# Patient Record
Sex: Male | Born: 1965 | Race: Black or African American | Hispanic: No | Smoking: Never smoker
Health system: Southern US, Community
[De-identification: ages and names within clinical notes are randomized; demographics above are authoritative.]

## PROBLEM LIST (undated history)

## (undated) DIAGNOSIS — R252 Cramp and spasm: Secondary | ICD-10-CM

## (undated) DIAGNOSIS — M542 Cervicalgia: Secondary | ICD-10-CM

## (undated) DIAGNOSIS — I10 Essential (primary) hypertension: Secondary | ICD-10-CM

## (undated) HISTORY — DX: Cervicalgia: M54.2

## (undated) HISTORY — DX: Essential (primary) hypertension: I10

## (undated) HISTORY — PX: EYE SURGERY: SHX253

## (undated) HISTORY — DX: Cramp and spasm: R25.2

---

## 2013-07-04 ENCOUNTER — Encounter (HOSPITAL_COMMUNITY): Payer: Self-pay | Admitting: Emergency Medicine

## 2013-07-04 ENCOUNTER — Emergency Department (HOSPITAL_COMMUNITY)
Admission: EM | Admit: 2013-07-04 | Discharge: 2013-07-04 | Disposition: A | Discharge status: Home / Self Care | Payer: PRIVATE HEALTH INSURANCE | Source: Home / Self Care

## 2013-07-04 DIAGNOSIS — S838X9A Sprain of other specified parts of unspecified knee, initial encounter: Secondary | ICD-10-CM

## 2013-07-04 DIAGNOSIS — S86112A Strain of other muscle(s) and tendon(s) of posterior muscle group at lower leg level, left leg, initial encounter: Secondary | ICD-10-CM

## 2013-07-04 DIAGNOSIS — S86819A Strain of other muscle(s) and tendon(s) at lower leg level, unspecified leg, initial encounter: Secondary | ICD-10-CM

## 2013-07-04 NOTE — ED Provider Notes (Signed)
Medical screening examination/treatment/procedure(s) were performed by a resident physician or non-physician practitioner and as the supervising physician I was immediately available for consultation/collaboration.  Masao Junker, MD    Leticia Mcdiarmid S Maricruz Lucero, MD 07/04/13 1810 

## 2013-07-04 NOTE — ED Notes (Signed)
Med  aso   Applied  Affected  ankle

## 2013-07-04 NOTE — ED Provider Notes (Signed)
CSN: [id number]     Arrival date & time 07/04/13  1011 History   First MD Initiated Contact with Patient 07/04/13 1040     Chief Complaint  Patient presents with  . Leg Pain   (Consider location/radiation/quality/duration/timing/severity/associated sxs/prior Treatment) HPI Comments: [age] year old [facility] and felt a pop in the left gastrocnemius muscle. He then stopped he exercises. He is complaining of pain along the mid  groove of the gastrocnemius bodies. Pain with ambulation.    History reviewed. No pertinent past medical history. History reviewed. No pertinent past surgical history. No family history on file. History  Substance Use Topics  . Smoking status: Not on file  . Smokeless tobacco: Not on file  . Alcohol Use: Not on file    Review of Systems  Constitutional: Negative.   Respiratory: Negative.   Gastrointestinal: Negative.   Genitourinary: Negative.   Musculoskeletal: Negative for back pain, neck pain and neck stiffness.       As per HPI  Skin: Negative.   Neurological: Negative for dizziness, weakness, numbness and headaches.    Allergies  Review of patient's allergies indicates no known allergies.  Home Medications  No current outpatient prescriptions on file. BP 146/81  Pulse 74  Temp(Src) 97 F (36.1 C) (Oral)  Resp 18  SpO2 99% Physical Exam  Nursing note and vitals reviewed. Constitutional: He is oriented to person, place, and time. He appears well-developed and well-nourished. No distress.  HENT:  Head: Normocephalic and atraumatic.  Eyes: EOM are normal.  Neck: Normal range of motion. Neck supple.  Pulmonary/Chest: Effort normal. No respiratory distress.  Musculoskeletal:  Minor swelling of the L calf. No discoloration. No palpable muscle contraction suggestive of rupture. Calf muscle with minor tenderness. Soft, no tension, smooth along the length of the leg with normal contour. Distal N/V, M/S  intact. Plantar flexion intact but produces mild to moderate calf pain. No evidence of compartment syndrome.   Neurological: He is alert and oriented to person, place, and time. No cranial nerve deficit.  Skin: Skin is warm and dry.  Psychiatric: He has a normal mood and affect.    ED Course  Procedures (including critical care time) Labs Review Labs Reviewed - No data to display Imaging Review No results found.   MDM   1. Strain of gastrocnemius muscle of left lower extremity      No evidence of rupture. More likely muscle strain with micro tears.  Limit contraction of calf m. and wear ASO to assist with that. Ice then heat. RICE. Limit activity as dir. Slowly rehab as discussed     Hayden Rasmussenavid Dhiren Azimi, NP 07/04/13 1108

## 2013-07-04 NOTE — Discharge Instructions (Signed)
Muscle Strain A muscle strain is an injury that occurs when a muscle is stretched beyond its normal length. Usually a small number of muscle fibers are torn when this happens. Muscle strain is rated in degrees. First-degree strains have the least amount of muscle fiber tearing and pain. Second-degree and third-degree strains have increasingly more tearing and pain.  Usually, recovery from muscle strain takes 1 2 weeks. Complete healing takes 5 6 weeks.  CAUSES  Muscle strain happens when a sudden, violent force placed on a muscle stretches it too far. This may occur with lifting, sports, or a fall.  RISK FACTORS Muscle strain is especially common in athletes.  SIGNS AND SYMPTOMS At the site of the muscle strain, there may be:  Pain.  Bruising.  Swelling.  Difficulty using the muscle due to pain or lack of normal function. DIAGNOSIS  Your health care provider will perform a physical exam and ask about your medical history. TREATMENT  Often, the best treatment for a muscle strain is resting, icing, and applying cold compresses to the injured area.  HOME CARE INSTRUCTIONS   Use the PRICE method of treatment to promote muscle healing during the first 2 3 days after your injury. The PRICE method involves:  Protecting the muscle from being injured again.  Restricting your activity and resting the injured body part.  Icing your injury. To do this, put ice in a plastic bag. Place a towel between your skin and the bag. Then, apply the ice and leave it on from 15 20 minutes each hour. After the third day, switch to moist heat packs.  Apply compression to the injured area with a splint or elastic bandage. Be careful not to wrap it too tightly. This may interfere with blood circulation or increase swelling.  Elevate the injured body part above the level of your heart as often as you can.  Only take over-the-counter or prescription medicines for pain, discomfort, or fever as directed by your  health care provider.  Warming up prior to exercise helps to prevent future muscle strains. SEEK MEDICAL CARE IF:   You have increasing pain or swelling in the injured area.  You have numbness, tingling, or a significant loss of strength in the injured area. MAKE SURE YOU:   Understand these instructions.  Will watch your condition.  Will get help right away if you are not doing well or get worse. Document Released: 03/28/2005 Document Revised: 01/16/2013 Document Reviewed: 10/25/2012 [facility] Patient Information 2014 [place], [place].  Muscle Tear A muscle tear is usually caused by over-exertion or stretching. The muscle often takes a while to heal. Muscle tears require 3 to 4 weeks to heal completely. A history of the injury and a physical exam may be performed. Sometimes, the injury is identified with radiographs and an MRI. Treatment for muscle injuries includes:  Resting and protecting the affected area until pain with motion is gone.  Putting ice on the injured area.  Put ice in a bag.  Place a towel between your skin and the bag.  Leave the ice on for 15 to 20 minutes, 3 to 4 times a day.  After two days, you can use heat to relieve spasms.  Using compression wraps to help control swelling and limit movement.  Raising (elevate) the injured area above the level of the heart (if possible) for the first 1 to 2 days after the injury.  Medicine may be prescribed to reduce pain and inflammation. Avoid strenuous activities that bring  on muscle pain. Exercises to strengthen and stretch the injured muscle can help heal the muscle and prevent repeated injury. After the pain and swelling are gone, you may begin gradual strengthening exercises. Begin range-of-motion exercises and gentle stretching after 3 to 4 days of rest.  SEEK MEDICAL CARE IF:  Your injured muscle is not improving after 1 week of treatment. Document Released: 05/05/2004 Document Revised: 06/20/2011 Document  Reviewed: 10/10/2008 [facility] Patient Information 2014 [place].

## 2013-07-04 NOTE — ED Notes (Signed)
Pt  Reports    Pain   l      Calf        Felt  A   Pop       When         He  Was  Working            [facility]  [facility] [employer]          He  Reports           Pain   On  Weight  Bearing       Pt  denys   Any          Chest  Pain or  Shortness  Of  Breath

## 2013-08-05 ENCOUNTER — Telehealth: Payer: Self-pay

## 2013-08-06 NOTE — Telephone Encounter (Signed)
ERROR

## 2013-12-10 ENCOUNTER — Ambulatory Visit
Admission: RE | Admit: 2013-12-10 | Discharge: 2013-12-10 | Disposition: A | Discharge status: Home / Self Care | Payer: 59 | Source: Ambulatory Visit | Attending: Internal Medicine | Admitting: Internal Medicine

## 2013-12-10 ENCOUNTER — Other Ambulatory Visit: Payer: Self-pay | Admitting: Internal Medicine

## 2013-12-10 DIAGNOSIS — M25512 Pain in left shoulder: Secondary | ICD-10-CM

## 2014-04-11 DIAGNOSIS — R252 Cramp and spasm: Secondary | ICD-10-CM

## 2014-04-11 HISTORY — DX: Cramp and spasm: R25.2

## 2014-10-14 ENCOUNTER — Encounter: Payer: Self-pay | Admitting: Internal Medicine

## 2015-01-27 ENCOUNTER — Encounter: Payer: Self-pay | Admitting: Neurology

## 2015-01-27 ENCOUNTER — Ambulatory Visit (INDEPENDENT_AMBULATORY_CARE_PROVIDER_SITE_OTHER): Payer: BLUE CROSS/BLUE SHIELD | Admitting: Neurology

## 2015-01-27 VITALS — BP 171/108 | HR 64 | Ht 66.0 in | Wt 173.0 lb

## 2015-01-27 DIAGNOSIS — M79605 Pain in left leg: Secondary | ICD-10-CM | POA: Diagnosis not present

## 2015-01-27 DIAGNOSIS — I10 Essential (primary) hypertension: Secondary | ICD-10-CM | POA: Diagnosis not present

## 2015-01-27 DIAGNOSIS — M5010 Cervical disc disorder with radiculopathy, unspecified cervical region: Secondary | ICD-10-CM

## 2015-01-27 MED ORDER — LISINOPRIL 10 MG PO TABS: 10.0000 mg | ORAL_TABLET | Freq: Two times a day (BID) | ORAL | Status: DC

## 2015-01-27 NOTE — Addendum Note (Signed)
Addended by: Levert FeinsteinYAN, Tranise Forrest on: 01/27/2015 09:22 AM   Modules accepted: Level of Service

## 2015-01-27 NOTE — Progress Notes (Signed)
PATIENT: Ryan Rosario DOB: 10-02-1965  Chief Complaint  Patient presents with  . Neck Pain    He has positional neck pain with tingling sensations in his bilateral arms.  He also has tingling in his left index finger and cold sensations in his right 4th and 5th fingers.  He was given gabapentin 300mg , BID by urgent care but has not experienced any relief with the medication.   . Leg Pain    He has been having throbbing, cramping pain in his left hamstring and left calf.     HISTORICAL  Ryan Rosario is a 49 years old left-handed male seen in refer by urgent care PA Alois Clicheracey Aguilar, for evaluation of neck pain, bilateral hands paresthesia in January 27 2015  He had past medical history of hypertension, taking lisinopril 10 mg daily, but with persistent hypertension, today's blood pressure is 170/108  He complains of chronic neck pain for many years, getting worse since 2015, he also noticed almost persistent left index finger numbness cold sensation, as if he is dipping his left index finger in a bucket of ice water, few years back, he used to do weight lifting, he noticed mild bilateral upper extremity weakness, but there was no limitation at his daily activity, he also reported when he hyperextended his neck, he felt radiating numbness traveling down his spine, to his bilateral shoulder, bilateral upper extremity, left worse than right, mainly involving left thumb and index fingers. He denies gait difficulty, since August 2016, he also noticed left posterior thigh and left calf muscle tightness, irritations, he denies bowel and bladder, denies significant low back pain.  REVIEW OF SYSTEMS: Full 14 system review of systems performed and notable only for as above   ALLERGIES: No Known Allergies  HOME MEDICATIONS: Current Outpatient Prescriptions  Medication Sig Dispense Refill  . gabapentin (NEURONTIN) 300 MG capsule Take 300 mg by mouth 2 (two) times daily.    Marland Kitchen. lisinopril  (PRINIVIL,ZESTRIL) 10 MG tablet Take 10 mg by mouth daily.     No current facility-administered medications for this visit.    PAST MEDICAL HISTORY: Past Medical History  Diagnosis Date  . Hypertension   . Neck pain   . Leg cramps     PAST SURGICAL HISTORY: Past Surgical History  Procedure Laterality Date  . Eye surgery      [facility] - 1974    FAMILY HISTORY: Family History  Problem Relation Age of Onset  . Hypertension Mother   . Hypertension Father   . Diabetes Father   . Congestive Heart Failure Father   . Aneurysm Mother     SOCIAL HISTORY:  Social History   Social History  . Marital Status: Single    Spouse Name: N/A  . Number of Children: 1  . Years of Education: BS degree   Occupational History  . Data Coordinator    Social History Main Topics  . Smoking status: Former Games developermoker  . Smokeless tobacco: Not on file  . Alcohol Use: No     Comment: No use since 1983  . Drug Use: No  . Sexual Activity: Not on file   Other Topics Concern  . Not on file   Social History Narrative   Lives at home with his father and his Geophysicist/field seismologistassistant.   Right-handed.   1-2 cups caffeine per day.     PHYSICAL EXAM   Filed Vitals:   01/27/15 0821  BP: 171/108  Pulse: 64  Height: 5\' 6"  (1.676 m)  Weight: 173 lb (78.472 kg)    Not recorded      Body mass index is 27.94 kg/(m^2).  PHYSICAL EXAMNIATION:  Gen: NAD, conversant, well nourised, obese, well groomed                     Cardiovascular: Regular rate rhythm, no peripheral edema, warm, nontender. Eyes: Conjunctivae clear without exudates or hemorrhage Neck: Supple, no carotid bruise. Pulmonary: Clear to auscultation bilaterally   NEUROLOGICAL EXAM:  MENTAL STATUS: Speech:    Speech is normal; fluent and spontaneous with normal comprehension.  Cognition:     Orientation to time, place and person     Normal recent and remote memory     Normal Attention span and concentration     Normal Language, naming,  repeating,spontaneous speech     Fund of knowledge   CRANIAL NERVES: CN II: Visual fields are full to confrontation. Fundoscopic exam is normal with sharp discs and no vascular changes. Pupils are round equal and briskly reactive to light. CN III, IV, VI: extraocular movement are normal. No ptosis. CN V: Facial sensation is intact to pinprick in all 3 divisions bilaterally. Corneal responses are intact.  CN VII: Face is symmetric with normal eye closure and smile. CN VIII: Hearing is normal to rubbing fingers CN IX, X: Palate elevates symmetrically. Phonation is normal. CN XI: Head turning and shoulder shrug are intact CN XII: Tongue is midline with normal movements and no atrophy.  MOTOR: There is no pronator drift of out-stretched arms. Muscle bulk and tone are normal. Muscle strength is normal, with exception of mild left finger abduction weakness  REFLEXES: Reflexes are 2+ and symmetric at the biceps, triceps, knees, and ankles. Plantar responses are flexor.  SENSORY: Intact to light touch, pinprick, position sense, and vibration sense are intact in fingers and toes.  COORDINATION: Rapid alternating movements and fine finger movements are intact. There is no dysmetria on finger-to-nose and heel-knee-shin.    GAIT/STANCE: Posture is normal. Gait is steady with normal steps, base, arm swing, and turning. Heel and toe walking are normal. Tandem gait is normal.  Romberg is absent.   DIAGNOSTIC DATA (LABS, IMAGING, TESTING) - I reviewed patient records, labs, notes, testing and imaging myself where available.   ASSESSMENT AND PLAN  Daunte Oestreich is a 49 y.o. male   Neck pain, radiating pain to bilateral upper extremity, most consistent with cervical radiculopathy  MRI of cervical spine  EMG nerve conduction study Left posterior thigh and and calf muscle cramping  Differentiated diagnosis including left lumbar radiculopathy, left lower extremity musculoskeletal etiology  Levert Feinstein, M.D. Ph.D.  [facility] [address], [address] [place], [place] [address] Ph: [phone] Fax: [phone]  CC: Alois Cliche, PA-

## 2015-02-02 ENCOUNTER — Encounter: Payer: Self-pay | Admitting: Neurology

## 2015-02-18 ENCOUNTER — Ambulatory Visit (INDEPENDENT_AMBULATORY_CARE_PROVIDER_SITE_OTHER): Payer: BLUE CROSS/BLUE SHIELD

## 2015-02-18 DIAGNOSIS — M79605 Pain in left leg: Secondary | ICD-10-CM | POA: Diagnosis not present

## 2015-02-18 DIAGNOSIS — M5010 Cervical disc disorder with radiculopathy, unspecified cervical region: Secondary | ICD-10-CM | POA: Diagnosis not present

## 2015-02-18 DIAGNOSIS — I10 Essential (primary) hypertension: Secondary | ICD-10-CM

## 2015-02-23 NOTE — Telephone Encounter (Signed)
Michelle: Please call patient, cervical mri showed degenerative changes, he has mild C5-6 canal stenosis. Will review findings detail at his next follow-up visit  IMPRESSION: Abnormal MRI scan of the cervical spine showing prominent spondylitic changes from C4-C7 , most severe at C 5-6 resulting in broad-based disc bulge with moderate canal and foraminal narrowing and cord signal abnormality likely myelomalacia

## 2015-02-26 ENCOUNTER — Telehealth: Payer: Self-pay | Admitting: Neurology

## 2015-02-26 NOTE — Telephone Encounter (Signed)
Patient called to advise he saw his MRI results through MyChart and it looks grim, patient wants to know the prognosis "how bad is it". Patient also mentioned that his father's pacemaker information is available through his MyChart account.

## 2015-02-26 NOTE — Telephone Encounter (Signed)
I have called him, MRI cervical showed evidence of prominent spondylitic changes, most severe at C5-6, with moderate canal stenosis, and spinal cord myelomalacia. Overall he has no significant gait difficulty, no bowel and bladder incontinence, chronic neck pain, will keep follow-up appointment in April 08 2015,   IMPRESSION: Abnormal MRI scan of the cervical spine showing prominent spondylitic changes from C4-C7 , most severe at C 5-6 resulting in broad-based disc bulge with moderate canal and foraminal narrowing and cord signal abnormality likely myelomalacia

## 2015-03-03 ENCOUNTER — Encounter: Payer: BLUE CROSS/BLUE SHIELD | Admitting: Neurology

## 2015-04-08 ENCOUNTER — Ambulatory Visit (INDEPENDENT_AMBULATORY_CARE_PROVIDER_SITE_OTHER): Payer: BLUE CROSS/BLUE SHIELD | Admitting: Neurology

## 2015-04-08 DIAGNOSIS — M4712 Other spondylosis with myelopathy, cervical region: Secondary | ICD-10-CM

## 2015-04-08 DIAGNOSIS — M79602 Pain in left arm: Secondary | ICD-10-CM | POA: Diagnosis not present

## 2015-04-08 DIAGNOSIS — M5010 Cervical disc disorder with radiculopathy, unspecified cervical region: Secondary | ICD-10-CM

## 2015-04-08 DIAGNOSIS — M79605 Pain in left leg: Secondary | ICD-10-CM

## 2015-04-08 DIAGNOSIS — I10 Essential (primary) hypertension: Secondary | ICD-10-CM

## 2015-04-09 ENCOUNTER — Encounter: Payer: Self-pay | Admitting: Neurology

## 2015-04-09 DIAGNOSIS — M47812 Spondylosis without myelopathy or radiculopathy, cervical region: Secondary | ICD-10-CM | POA: Insufficient documentation

## 2015-04-09 NOTE — Addendum Note (Signed)
Addended by: Levert FeinsteinYAN, Damarian Priola on: 04/09/2015 04:25 PM   Modules accepted: Level of Service

## 2015-04-09 NOTE — Progress Notes (Signed)
PATIENT: Ryan Rosario DOB: September 13, 1965  No chief complaint on file.    HISTORICAL  Ryan Rosario is a 49 years old left-handed male seen in refer by urgent care PA Alois Clicheracey Aguilar, for evaluation of neck pain, bilateral hands paresthesia in January 27 2015  He had past medical history of hypertension, taking lisinopril 10 mg daily, but with persistent hypertension, today's blood pressure is 170/108  He complains of chronic neck pain for many years, getting worse since 2015, he also noticed almost persistent left index finger numbness cold sensation, as if he is dipping his left index finger in a bucket of ice water, few years back, he used to do weight lifting, he noticed mild bilateral upper extremity weakness, but there was no limitation at his daily activity, he also reported when he hyperextended his neck, he felt radiating numbness traveling down his spine, to his bilateral shoulder, bilateral upper extremity, left worse than right, mainly involving left thumb and index fingers. He denies gait difficulty, since August 2016, he also noticed left posterior thigh and left calf muscle tightness, irritations, he denies bowel and bladder, denies significant low back pain.  Update April 08 2015: Patient come in for electrodiagnostic study today, which showed no significant abnormality, in specific there was no evidence of left upper extremity neuropathy or left cervical radiculopathy.  We also personally reviewed MRI of the cervical spine November 2016: prominent spondylitic changes from C4-C7 , most severe at C 5-6 resulting in broad-based disc bulge with moderate canal and foraminal narrowing and cord signal abnormality likely myelomalacia  He denies significant gait difficulty no bowel bladder incontinence, continue has left side neck pain radiating pain to left upper extremity, hyperreflexia of upper and lower extremity on examinations,  REVIEW OF SYSTEMS: Full 14 system review of  systems performed and notable only for as above   ALLERGIES: No Known Allergies  HOME MEDICATIONS: Current Outpatient Prescriptions  Medication Sig Dispense Refill  . gabapentin (NEURONTIN) 300 MG capsule Take 300 mg by mouth 2 (two) times daily.    Marland Kitchen. lisinopril (PRINIVIL,ZESTRIL) 10 MG tablet Take 1 tablet (10 mg total) by mouth 2 (two) times daily. 60 tablet 6   No current facility-administered medications for this visit.    PAST MEDICAL HISTORY: Past Medical History  Diagnosis Date  . Hypertension   . Neck pain   . Leg cramps     PAST SURGICAL HISTORY: Past Surgical History  Procedure Laterality Date  . Eye surgery      [facility] - 1974    FAMILY HISTORY: Family History  Problem Relation Age of Onset  . Hypertension Mother   . Hypertension Father   . Diabetes Father   . Congestive Heart Failure Father   . Aneurysm Mother     SOCIAL HISTORY:  Social History   Social History  . Marital Status: Single    Spouse Name: N/A  . Number of Children: 1  . Years of Education: BS degree   Occupational History  . Data Coordinator    Social History Main Topics  . Smoking status: Former Games developermoker  . Smokeless tobacco: Not on file  . Alcohol Use: No     Comment: No use since 1983  . Drug Use: No  . Sexual Activity: Not on file   Other Topics Concern  . Not on file   Social History Narrative   Lives at home with his father and his Geophysicist/field seismologistassistant.   Right-handed.   1-2 cups caffeine per  day.     PHYSICAL EXAM   There were no vitals filed for this visit.  Not recorded      There is no weight on file to calculate BMI.  PHYSICAL EXAMNIATION:  Gen: NAD, conversant, well nourised, obese, well groomed                     Cardiovascular: Regular rate rhythm, no peripheral edema, warm, nontender. Eyes: Conjunctivae clear without exudates or hemorrhage Neck: Supple, no carotid bruise. Pulmonary: Clear to auscultation bilaterally   NEUROLOGICAL EXAM:  MENTAL  STATUS: Speech:    Speech is normal; fluent and spontaneous with normal comprehension.  Cognition:     Orientation to time, place and person     Normal recent and remote memory     Normal Attention span and concentration     Normal Language, naming, repeating,spontaneous speech     Fund of knowledge   CRANIAL NERVES: CN II: Visual fields are full to confrontation. Fundoscopic exam is normal with sharp discs and no vascular changes. Pupils are round equal and briskly reactive to light. CN III, IV, VI: extraocular movement are normal. No ptosis. CN V: Facial sensation is intact to pinprick in all 3 divisions bilaterally. Corneal responses are intact.  CN VII: Face is symmetric with normal eye closure and smile. CN VIII: Hearing is normal to rubbing fingers CN IX, X: Palate elevates symmetrically. Phonation is normal. CN XI: Head turning and shoulder shrug are intact CN XII: Tongue is midline with normal movements and no atrophy.  MOTOR: There is no pronator drift of out-stretched arms. Muscle bulk and tone are normal. Muscle strength is normal, with exception of mild left finger abduction weakness  REFLEXES: Reflexes are 3 and symmetric at the biceps, triceps, knees, and ankles. Plantar responses are flexor.  SENSORY: Intact to light touch, pinprick, position sense, and vibration sense are intact in fingers and toes.  COORDINATION: Rapid alternating movements and fine finger movements are intact. There is no dysmetria on finger-to-nose and heel-knee-shin.    GAIT/STANCE: Posture is normal. Gait is steady with normal steps, base, arm swing, and turning. Heel and toe walking are normal. Tandem gait is normal.  Romberg is absent.   DIAGNOSTIC DATA (LABS, IMAGING, TESTING) - I reviewed patient records, labs, notes, testing and imaging myself where available.   ASSESSMENT AND PLAN  Ryan Rosario is a 49 y.o. male   Neck pain, radiating pain to bilateral upper extremity, most  consistent with cervical radiculopathy   Left posterior thigh and and calf muscle cramping  He does have hyperreflexia on examinations, consistent with cervical myelopathy,    we have personally reviewed MRI of the cervical spine November 2016: prominent spondylitic changes from C4-C7 ,  most severe at C 5-6 resulting in  broad-based disc bulgewith moderate canal and foraminal narrowing and cord signal abnormality likely myelomalacia    I have discussed with patient about potential surgical compression, he decided to hold off surgical evaluation at this point, continue gabapentin for symptomatic control,  Return to clinic in 6 months   Levert Feinstein, M.D. Ph.D.  [facility] [address] [place], [place] [address] Ph: [phone] Fax: [phone]  CC: Alois Cliche, PA-

## 2015-04-09 NOTE — Procedures (Addendum)
   NCS (NERVE CONDUCTION STUDY) WITH EMG (ELECTROMYOGRAPHY) REPORT   STUDY DATE: April 08 2015 PATIENT NAME: Ryan Rosario DOB: 08-May-1965 MRN: [id number]    TECHNOLOGIST: Gearldine ShownLorraine Jones ELECTROMYOGRAPHER: Levert FeinsteinYan, Joshu Furukawa M.D.  CLINICAL INFORMATION:   49 years old left-handed male, with history of chronic neck pain radiating pain to left arm left hand   On examination: Bilateral upper extremity motor strength is normal, deep tendon reflexes was present and symmetric, sensory examination was intact  FINDINGS: NERVE CONDUCTION STUDY: Left peroneal sensory responses were normal. Left peroneal to EDB and tibial motor responses were normal. Bilateral median, ulnar sensory and motor responses were normal.  NEEDLE ELECTROMYOGRAPHY: Selected needle examination was performed at left upper extremity muscles, left cervical paraspinal muscles,  Needle examination of left pronator teres, biceps, triceps, deltoid, extensor digitorum communis was normal  There was no spontaneous activity at left cervical paraspinal muscles, left C 5, 6, 7.   IMPRESSION:   This is a normal study. There is no electrodiagnostic evidence of left upper extremity neuropathy or left cervical radiculopathy.   INTERPRETING PHYSICIAN:   Levert FeinsteinYan, Danel Requena M.D. Ph.D. [facility] [address], [address] [place], [place] [address] [phone]

## 2015-04-21 ENCOUNTER — Encounter: Payer: Self-pay | Admitting: Neurology

## 2015-04-22 ENCOUNTER — Other Ambulatory Visit: Payer: Self-pay | Admitting: *Deleted

## 2015-04-22 ENCOUNTER — Encounter: Payer: Self-pay | Admitting: *Deleted

## 2015-04-22 MED ORDER — GABAPENTIN 300 MG PO CAPS: 300.0000 mg | ORAL_CAPSULE | Freq: Three times a day (TID) | ORAL | Status: DC

## 2015-05-21 DIAGNOSIS — I10 Essential (primary) hypertension: Secondary | ICD-10-CM | POA: Insufficient documentation

## 2015-07-27 ENCOUNTER — Encounter: Payer: Self-pay | Admitting: Family

## 2015-07-27 ENCOUNTER — Encounter: Payer: Self-pay | Admitting: Internal Medicine

## 2015-07-28 ENCOUNTER — Encounter: Payer: Self-pay | Admitting: Internal Medicine

## 2015-09-18 ENCOUNTER — Ambulatory Visit (AMBULATORY_SURGERY_CENTER): Payer: Self-pay | Admitting: *Deleted

## 2015-09-18 VITALS — Ht 66.0 in | Wt 172.0 lb

## 2015-09-18 DIAGNOSIS — Z1211 Encounter for screening for malignant neoplasm of colon: Secondary | ICD-10-CM

## 2015-09-18 MED ORDER — SUPREP BOWEL PREP KIT 17.5-3.13-1.6 GM/177ML PO SOLN: 1.0000 | Freq: Once | ORAL | Status: DC

## 2015-09-18 NOTE — Progress Notes (Signed)
Patient denies any allergies to egg or soy products. Patient denies complications with anesthesia/sedation.  Patient denies oxygen use at home and denies diet medications. Emmi instructions for colonoscopy explained and given to patient.  

## 2015-09-21 ENCOUNTER — Encounter: Payer: Self-pay | Admitting: Neurology

## 2015-09-21 ENCOUNTER — Encounter: Payer: Self-pay | Admitting: Internal Medicine

## 2015-10-02 ENCOUNTER — Ambulatory Visit (AMBULATORY_SURGERY_CENTER): Payer: BLUE CROSS/BLUE SHIELD | Admitting: Internal Medicine

## 2015-10-02 ENCOUNTER — Encounter: Payer: Self-pay | Admitting: Internal Medicine

## 2015-10-02 VITALS — BP 146/83 | HR 75 | Temp 98.7°F | Resp 22 | Ht 66.0 in | Wt 172.0 lb

## 2015-10-02 DIAGNOSIS — Z1211 Encounter for screening for malignant neoplasm of colon: Secondary | ICD-10-CM

## 2015-10-02 MED ORDER — SODIUM CHLORIDE 0.9 % IV SOLN: 500.0000 mL | INTRAVENOUS | Status: DC

## 2015-10-02 NOTE — Patient Instructions (Signed)
YOU HAD AN ENDOSCOPIC PROCEDURE TODAY AT THE [facility]:   Refer to the procedure report that was given to you for any specific questions about what was found during the examination.  If the procedure report does not answer your questions, please call your gastroenterologist to clarify.  If you requested that your care partner not be given the details of your procedure findings, then the procedure report has been included in a sealed envelope for you to review at your convenience later.  YOU SHOULD EXPECT: Some feelings of bloating in the abdomen. Passage of more gas than usual.  Walking can help get rid of the air that was put into your GI tract during the procedure and reduce the bloating. If you had a lower endoscopy (such as a colonoscopy or flexible sigmoidoscopy) you may notice spotting of blood in your stool or on the toilet paper. If you underwent a bowel prep for your procedure, you may not have a normal bowel movement for a few days.  Please Note:  You might notice some irritation and congestion in your nose or some drainage.  This is from the oxygen used during your procedure.  There is no need for concern and it should clear up in a day or so.  SYMPTOMS TO REPORT IMMEDIATELY:   Following lower endoscopy (colonoscopy or flexible sigmoidoscopy):  Excessive amounts of blood in the stool  Significant tenderness or worsening of abdominal pains  Swelling of the abdomen that is new, acute  Fever of 100F or higher   Following upper endoscopy (EGD)  Vomiting of blood or coffee ground material  New chest pain or pain under the shoulder blades  Painful or persistently difficult swallowing  New shortness of breath  Fever of 100F or higher  Black, tarry-looking stools  For urgent or emergent issues, a gastroenterologist can be reached at any hour by calling [phone].   DIET: Your first meal following the procedure should be a small meal and then it is ok to progress to  your normal diet. Heavy or fried foods are harder to digest and may make you feel nauseous or bloated.  Likewise, meals heavy in dairy and vegetables can increase bloating.  Drink plenty of fluids but you should avoid alcoholic beverages for 24 hours.  ACTIVITY:  You should plan to take it easy for the rest of today and you should NOT DRIVE or use heavy machinery until tomorrow (because of the sedation medicines used during the test).    FOLLOW UP: Our staff will call the number listed on your records the next business day following your procedure to check on you and address any questions or concerns that you may have regarding the information given to you following your procedure. If we do not reach you, we will leave a message.  However, if you are feeling well and you are not experiencing any problems, there is no need to return our call.  We will assume that you have returned to your regular daily activities without incident.  If any biopsies were taken you will be contacted by phone or by letter within the next 1-3 weeks.  Please call us at [phone] if you have not heard about the biopsies in 3 weeks.    SIGNATURES/CONFIDENTIALITY: You and/or your care partner have signed paperwork which will be entered into your electronic medical record.  These signatures attest to the fact that that the information above on your After Visit Summary has been reviewed   and is understood.  Full responsibility of the confidentiality of this discharge information lies with you and/or your care-partner.  Next colonoscopy 10 years-2027 

## 2015-10-02 NOTE — Op Note (Signed)
[facility] Patient Name: Ryan Rosario Procedure Date: 10/02/2015 9:38 AM MRN: [id number] Endoscopist: Wilhemina BonitoJohn N. Marina GoodellPerry , MD Age: [age] Referring MD:  Date of Birth: 10-15-1965 Gender: Male Account #: [id number] Procedure:                Colonoscopy Indications:              Screening for colorectal malignant neoplasm Medicines:                Monitored Anesthesia Care Procedure:                Pre-Anesthesia Assessment:                           - Prior to the procedure, a History and Physical                            was performed, and patient medications and                            allergies were reviewed. The patient's tolerance of                            previous anesthesia was also reviewed. The risks                            and benefits of the procedure and the sedation                            options and risks were discussed with the patient.                            All questions were answered, and informed consent                            was obtained. Prior Anticoagulants: The patient has                            taken no previous anticoagulant or antiplatelet                            agents. ASA Grade Assessment: I - A normal, healthy                            patient. After reviewing the risks and benefits,                            the patient was deemed in satisfactory condition to                            undergo the procedure.                           After obtaining informed consent, the colonoscope  was passed under direct vision. Throughout the                            procedure, the patient's blood pressure, pulse, and                            oxygen saturations were monitored continuously. The                            Model CF-HQ190L [id number]) scope was introduced                            through the anus and advanced to the the cecum,                            identified by appendiceal orifice  and ileocecal                            valve. The ileocecal valve, appendiceal orifice,                            and rectum were photographed. The quality of the                            bowel preparation was excellent. The colonoscopy                            was performed without difficulty. The patient                            tolerated the procedure well. The bowel preparation                            used was SUPREP. Scope In: 9:47:33 AM Scope Out: 9:58:51 AM Scope Withdrawal Time: 0 hours 8 minutes 28 seconds  Total Procedure Duration: 0 hours 11 minutes 18 seconds  Findings:                 The entire examined colon appeared normal on direct                            and retroflexion views. Complications:            No immediate complications. Estimated blood loss:                            None. Estimated Blood Loss:     Estimated blood loss: none. Impression:               - The entire examined colon is normal on direct and                            retroflexion views.                           - No specimens collected. Recommendation:           -  Repeat colonoscopy in 10 years for screening                            purposes.                           - Patient has a contact number available for                            emergencies. The signs and symptoms of potential                            delayed complications were discussed with the                            patient. Return to normal activities tomorrow.                            Written discharge instructions were provided to the                            patient.                           - Resume previous diet.                           - Continue present medications. Wilhemina BonitoJohn N. Marina GoodellPerry, MD 10/02/2015 10:04:02 AM This report has been signed electronically.

## 2015-10-02 NOTE — Progress Notes (Signed)
To recovery, report to Scott, RN, VSS 

## 2015-10-05 ENCOUNTER — Telehealth: Payer: Self-pay

## 2015-10-05 NOTE — Telephone Encounter (Signed)
  Follow up Call-  Call back number 10/02/2015  Post procedure Call Back phone  # [phone]  Permission to leave phone message Yes     Patient questions:  Do you have a fever, pain , or abdominal swelling? No. Pain Score  0 *  Have you tolerated food without any problems? Yes.    Have you been able to return to your normal activities? Yes.    Do you have any questions about your discharge instructions: Diet   No. Medications  No. Follow up visit  No.  Do you have questions or concerns about your Care? No.  Actions: * If pain score is 4 or above: No action needed, pain <4.

## 2015-10-07 ENCOUNTER — Ambulatory Visit: Payer: BLUE CROSS/BLUE SHIELD | Admitting: Neurology

## 2015-10-07 ENCOUNTER — Telehealth: Payer: Self-pay | Admitting: *Deleted

## 2015-10-07 NOTE — Telephone Encounter (Signed)
No showed follow up appointment. 

## 2015-10-08 ENCOUNTER — Encounter: Payer: Self-pay | Admitting: Neurology

## 2015-10-12 ENCOUNTER — Ambulatory Visit (INDEPENDENT_AMBULATORY_CARE_PROVIDER_SITE_OTHER): Payer: BLUE CROSS/BLUE SHIELD | Admitting: Nurse Practitioner

## 2015-10-12 ENCOUNTER — Encounter: Payer: Self-pay | Admitting: Nurse Practitioner

## 2015-10-12 VITALS — BP 142/89 | HR 62 | Ht 66.0 in | Wt 173.0 lb

## 2015-10-12 DIAGNOSIS — M4712 Other spondylosis with myelopathy, cervical region: Secondary | ICD-10-CM | POA: Diagnosis not present

## 2015-10-12 NOTE — Patient Instructions (Signed)
Continue neck exercises Try gentle massage therapy Follow-up in 6 months

## 2015-10-12 NOTE — Progress Notes (Signed)
[facility]  PATIENT: Ryan Rosario DOB: 1965-09-03   REASON FOR VISIT: Follow-up for left leg pain,  neck pain HISTORY FROM: Patient    HISTORY OF PRESENT ILLNESS: Mr. Ryan Rosario, 50 year old male returns for follow-up. He was last seen in the office by Dr. Terrace ArabiaYan 01/27/2015 for left leg pain and neck pain. MRI cervical  November 2016 showed evidence of prominent spondylitic changes, most severe at C5-6, with moderate canal stenosis, and spinal cord myelomalacia. EMG nerve conduction on 04/08/2015 with no significant abnormality in the upper extremities and no evidence of left upper extremity neuropathy or left cervical radiculopathy. He was placed on gabapentin 300 mg 3 times a day however he only took medication for a  week as he no  benefit. Overall he has no significant gait difficulty, no bowel and bladder incontinence, chronic neck pain . He has been taking Toumaric over-the-counter which she has found beneficial. He also occasionally takes anti-inflammatory. He is interested in getting some massage. He returns for reevaluation    HISTORY 01/27/15 Ryan Rosario is a 50 years old left-handed male seen in refer by urgent care PA Alois Clicheracey Aguilar, for evaluation of neck pain, bilateral hands paresthesia in January 27 2015  He had past medical history of hypertension, taking lisinopril 10 mg daily, but with persistent hypertension, today's blood pressure is 170/108  He complains of chronic neck pain for many years, getting worse since 2015, he also noticed almost persistent left index finger numbness cold sensation, as if he is dipping his left index finger in a bucket of ice water, few years back, he used to do weight lifting, he noticed mild bilateral upper extremity weakness, but there was no limitation at his daily activity, he also reported when he hyperextended his neck, he felt radiating numbness traveling down his spine, to his bilateral shoulder, bilateral upper extremity,  left worse than right, mainly involving left thumb and index fingers. He denies gait difficulty, since August 2016, he also noticed left posterior thigh and left calf muscle tightness, irritations, he denies bowel and bladder, denies significant low back pain.  REVIEW OF SYSTEMS: Full 14 system review of systems performed and notable only for those listed, all others are neg:  Constitutional: neg  Cardiovascular: neg Ear/Nose/Throat: neg  Skin: neg Eyes: neg Respiratory: neg Gastroitestinal: neg  Hematology/Lymphatic: neg  Endocrine: neg Musculoskeletal: Neck pain Allergy/Immunology: neg Neurological: Numbness which is intermittent Psychiatric: neg Sleep : neg   ALLERGIES: No Known Allergies  HOME MEDICATIONS: Outpatient Prescriptions Prior to Visit  Medication Sig Dispense Refill  . lisinopril (PRINIVIL,ZESTRIL) 10 MG tablet Take 1 tablet (10 mg total) by mouth 2 (two) times daily. 60 tablet 6  . gabapentin (NEURONTIN) 300 MG capsule Take 1 capsule (300 mg total) by mouth 3 (three) times daily. (Patient not taking: Reported on 09/18/2015) 90 capsule 6   No facility-administered medications prior to visit.    PAST MEDICAL HISTORY: Past Medical History  Diagnosis Date  . Hypertension   . Neck pain     hx - pinch nerve  . Leg cramps 2016    hx    PAST SURGICAL HISTORY: Past Surgical History  Procedure Laterality Date  . Eye surgery Left     [facility] - 1974    FAMILY HISTORY: Family History  Problem Relation Age of Onset  . Hypertension Mother   . Aneurysm Mother   . Hypertension Father   . Diabetes Father   . Congestive Heart Failure Father   .  Colon cancer Neg Hx   . Colon polyps Neg Hx   . Esophageal cancer Neg Hx   . Rectal cancer Neg Hx   . Stomach cancer Neg Hx     SOCIAL HISTORY: Social History   Social History  . Marital Status: Single    Spouse Name: N/A  . Number of Children: 1  . Years of Education: BS degree   Occupational History  . Data  Coordinator    Social History Main Topics  . Smoking status: Never Smoker   . Smokeless tobacco: Never Used  . Alcohol Use: No     Comment: No use since 1983  . Drug Use: No  . Sexual Activity: Not on file   Other Topics Concern  . Not on file   Social History Narrative   Lives at home with his father and his Geophysicist/field seismologistassistant.   Right-handed.   1-2 cups caffeine per day.     PHYSICAL EXAM  Filed Vitals:   10/12/15 0909  BP: 142/89  Pulse: 62  Height: 5\' 6"  (1.676 m)  Weight: 173 lb (78.472 kg)   Body mass index is 27.94 kg/(m^2). Gen: NAD, conversant, well nourised, well groomed  Cardiovascular: Regular rate rhythm,  Neck: Supple,  NEUROLOGICAL EXAM:  MENTAL STATUS: Speech:Speech is normal; fluent and spontaneous with normal comprehension.  Cognition:Orientation to time, place and person Normal recent and remote memory Normal Attention span and concentration Normal Language, naming, repeating,spontaneous speech Fund of knowledge  CRANIAL NERVES: CN II: Visual fields are full to confrontation. Pupils are round equal and briskly reactive to light. CN III, IV, VI: extraocular movement are normal. No ptosis. CN V: Facial sensation is intact to pinprick in all 3 divisions bilaterally.  CN VII: Face is symmetric with normal eye closure and smile. CN VIII: Hearing is normal to rubbing fingers CN IX, X: Palate elevates symmetrically. Phonation is normal. CN XI: Head turning and shoulder shrug are intact CN XII: Tongue is midline with normal movements and no atrophy.  MOTOR:There is no pronator drift of out-stretched arms. Muscle bulk and tone are normal. Muscle strength is normal, with exception of mild left finger abduction weakness REFLEXES:Reflexes are 2+ and symmetric at the biceps, triceps, knees, and ankles. Plantar responses are flexor. SENSORY:Intact to light touch, pinprick, position sense, and vibration sense are intact in fingers and  toes. COORDINATION:Rapid alternating movements and fine finger movements are intact. There is no dysmetria on finger-to-nose and heel-knee-shin.  GAIT/STANCE:Posture is normal. Gait is steady with normal steps, base, arm swing, and turning. Heel and toe walking are normal. Tandem gait is normal. No assistive device Romberg is absent.  DIAGNOSTIC DATA (LABS, IMAGING, TESTING) - ASSESSMENT AND PLAN  50 y.o. year old male  has a past medical history of Hypertension; Neck pain; and Leg cramps (2016). here to follow-up.MRI cervical  November 2016 showed evidence of prominent spondylitic changes, most severe at C5-6, with moderate canal stenosis, and spinal cord myelomalacia. EMG nerve conduction on 04/08/2015 with no significant abnormality in the upper extremities and no evidence of left upper extremity neuropathy or left cervical radiculopathy. He was placed on gabapentin but did not find this beneficial. His leg pain has subsided and is no longer an issue.  Continue neck exercises Try gentle massage therapy Follow-up in 6 months Nilda RiggsNancy Carolyn Pascual Mantel, [facility] And HospitalGNP, [facility] CenterBC, APRN  [facility] [address], [address] [place], [place] [address] [phone]

## 2015-10-12 NOTE — Progress Notes (Signed)
I have reviewed and agreed above plan. 

## 2016-02-01 ENCOUNTER — Encounter: Payer: Self-pay | Admitting: Neurology

## 2016-02-02 ENCOUNTER — Telehealth: Payer: Self-pay | Admitting: Neurology

## 2016-02-02 NOTE — Telephone Encounter (Signed)
Ryan Rosario please call give him a follow-up appointment for further discussion, this is based on his most recent email

## 2016-02-02 NOTE — Telephone Encounter (Signed)
Left message for patient to call our office and schedule an appt.  Dr. Terrace ArabiaYan also sent him an email requesting he make an appt to further discuss his medications and symptoms.

## 2016-02-04 ENCOUNTER — Ambulatory Visit (INDEPENDENT_AMBULATORY_CARE_PROVIDER_SITE_OTHER): Payer: BLUE CROSS/BLUE SHIELD | Admitting: Neurology

## 2016-02-04 ENCOUNTER — Encounter: Payer: Self-pay | Admitting: Neurology

## 2016-02-04 VITALS — BP 162/92 | HR 63 | Ht 66.0 in | Wt 173.0 lb

## 2016-02-04 DIAGNOSIS — M47812 Spondylosis without myelopathy or radiculopathy, cervical region: Secondary | ICD-10-CM

## 2016-02-04 MED ORDER — DULOXETINE HCL 60 MG PO CPEP: 60.0000 mg | ORAL_CAPSULE | Freq: Every day | ORAL | 11 refills | Status: DC

## 2016-02-04 NOTE — Progress Notes (Signed)
[facility]  PATIENT: Ryan Rosario DOB: Jan 19, 1966  HISTORY OF PRESENT ILLNESS: Mr. Ryan Rosario, 50 year old male returns for follow-up of left cervical radiculopathy  He was referred by urgent care PA Alois Cliche, for evaluation of neck pain, bilateral hands paresthesia, initially evaluation was on January 27 2015  He had past medical history of hypertension, taking lisinopril 10 mg daily, but with persistent hypertension, today's blood pressure is 170/108  He complains of chronic neck pain for many years, getting worse since 2015, he also noticed almost persistent left index finger numbness cold sensation, as if he is dipping his left index finger in a bucket of ice water, few years back, he used to do weight lifting, he noticed mild bilateral upper extremity weakness, but there was no limitation at his daily activity, he also reported when he hyperextended his neck, he felt radiating numbness traveling down his spine, to his bilateral shoulder, bilateral upper extremity, left worse than right, mainly involving left thumb and index fingers. He denies gait difficulty, since August 2016, he also noticed left posterior thigh and left calf muscle tightness, irritations, he denies bowel and bladder, denies significant low back pain.  Today we have personally reviewed MRI cervical spine on February 20 2015, showed prominent spondylitic changes from C4 to C7, most severe at C5-6, with broad based disc bulging, moderate canal foraminal narrowing, cord signal abnormality likely representing myelomalacia, also explain his persistent left hand paresthesia  He also had EMG nerve conduction study on January 2017, there was no evidence of active left cervical radiculopathy or left upper extremity neuropathy.  He was given a trial of gabapentin 300 mg 3 times a day, there was no significant benefit.  Today he came in complains of almost constant left arm paresthesia, cold sensation,  worsening by typing on the keyboard, he works at a desk job.  REVIEW OF SYSTEMS: Full 14 system review of systems performed and notable only for those listed, all others are neg:  Numbness   ALLERGIES: No Known Allergies  HOME MEDICATIONS: Outpatient Medications Prior to Visit  Medication Sig Dispense Refill  . lisinopril (PRINIVIL,ZESTRIL) 10 MG tablet Take 1 tablet (10 mg total) by mouth 2 (two) times daily. 60 tablet 6   No facility-administered medications prior to visit.     PAST MEDICAL HISTORY: Past Medical History:  Diagnosis Date  . Hypertension   . Leg cramps 2016   hx  . Neck pain    hx - pinch nerve    PAST SURGICAL HISTORY: Past Surgical History:  Procedure Laterality Date  . EYE SURGERY Left    [facility] - 1974    FAMILY HISTORY: Family History  Problem Relation Age of Onset  . Hypertension Mother   . Aneurysm Mother   . Hypertension Father   . Diabetes Father   . Congestive Heart Failure Father   . Colon cancer Neg Hx   . Colon polyps Neg Hx   . Esophageal cancer Neg Hx   . Rectal cancer Neg Hx   . Stomach cancer Neg Hx     SOCIAL HISTORY: Social History   Social History  . Marital status: Single    Spouse name: N/A  . Number of children: 1  . Years of education: BS degree   Occupational History  . Data Coordinator    Social History Main Topics  . Smoking status: Never Smoker  . Smokeless tobacco: Never Used  . Alcohol use No     Comment: No use  since 1983  . Drug use: No  . Sexual activity: Not on file   Other Topics Concern  . Not on file   Social History Narrative   Lives at home with his father and his Geophysicist/field seismologistassistant.   Right-handed.   1-2 cups caffeine per day.     PHYSICAL EXAM  Vitals:   02/04/16 0742  BP: (!) 162/92  Pulse: 63  Weight: 173 lb (78.5 kg)  Height: 5\' 6"  (1.676 m)   Body mass index is 27.92 kg/m. Gen: NAD, conversant, well nourised, well groomed  Cardiovascular: Regular rate  rhythm,  Neck: Supple,  NEUROLOGICAL EXAM:  MENTAL STATUS: Speech:Speech is normal; fluent and spontaneous with normal comprehension.  Cognition:Orientation to time, place and person Normal recent and remote memory Normal Attention span and concentration Normal Language, naming, repeating,spontaneous speech Fund of knowledge  CRANIAL NERVES: CN II: Visual fields are full to confrontation. Pupils are round equal and briskly reactive to light. CN III, IV, VI: extraocular movement are normal. No ptosis. CN V: Facial sensation is intact to pinprick in all 3 divisions bilaterally.  CN VII: Face is symmetric with normal eye closure and smile. CN VIII: Hearing is normal to rubbing fingers CN IX, X: Palate elevates symmetrically. Phonation is normal. CN XI: Head turning and shoulder shrug are intact CN XII: Tongue is midline with normal movements and no atrophy.  MOTOR:  He has mild left finger abduction weakness REFLEXES:Reflexes are 2+ and symmetric at the biceps, triceps, knees, and ankles. Plantar responses are flexor. SENSORY:Intact to light touch, pinprick, position sense, and vibration sense are intact in fingers and toes. COORDINATION:Rapid alternating movements and fine finger movements are intact. There is no dysmetria on finger-to-nose and heel-knee-shin.  GAIT/STANCE:Posture is normal. Gait is steady with normal steps, base, arm swing, and turning. Heel and toe walking are normal. Tandem gait is normal. No assistive device Romberg is absent.  DIAGNOSTIC DATA (LABS, IMAGING, TESTING) - ASSESSMENT AND PLAN  50 y.o. 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  He has tried and failed  gabapentin Will try Cymbalta 60 mg daily, After discuss with patient, will also refer him to neurosurgeon  Levert FeinsteinYijun Asa Fath, M.D. Ph.D.  [facility] [address] [place], [place] [address] Phone: [phone] Fax:      [phone]

## 2016-02-09 ENCOUNTER — Telehealth: Payer: Self-pay | Admitting: Neurology

## 2016-02-09 NOTE — Telephone Encounter (Signed)
Called and left message for patient to pick up his MRI disc to take with him to his Neuro Surgery apt. Relayed office hours.

## 2016-02-22 ENCOUNTER — Other Ambulatory Visit: Payer: Self-pay | Admitting: Neurology

## 2016-03-16 ENCOUNTER — Encounter: Payer: Self-pay | Admitting: Neurology

## 2019-02-17 ENCOUNTER — Emergency Department (HOSPITAL_COMMUNITY)
Admission: EM | Admit: 2019-02-17 | Discharge: 2019-02-17 | Disposition: A | Discharge status: Home / Self Care | Payer: BC Managed Care – PPO | Attending: Emergency Medicine | Admitting: Emergency Medicine

## 2019-02-17 ENCOUNTER — Other Ambulatory Visit: Payer: Self-pay

## 2019-02-17 ENCOUNTER — Encounter (HOSPITAL_COMMUNITY): Payer: Self-pay | Admitting: *Deleted

## 2019-02-17 ENCOUNTER — Ambulatory Visit (HOSPITAL_COMMUNITY)
Admission: EM | Admit: 2019-02-17 | Discharge: 2019-02-17 | Disposition: A | Discharge status: Home / Self Care | Payer: BC Managed Care – PPO | Source: Home / Self Care

## 2019-02-17 ENCOUNTER — Emergency Department (HOSPITAL_COMMUNITY): Payer: BC Managed Care – PPO

## 2019-02-17 ENCOUNTER — Encounter (HOSPITAL_COMMUNITY): Payer: Self-pay

## 2019-02-17 DIAGNOSIS — I10 Essential (primary) hypertension: Secondary | ICD-10-CM | POA: Diagnosis not present

## 2019-02-17 DIAGNOSIS — N132 Hydronephrosis with renal and ureteral calculous obstruction: Secondary | ICD-10-CM | POA: Insufficient documentation

## 2019-02-17 DIAGNOSIS — Z79899 Other long term (current) drug therapy: Secondary | ICD-10-CM | POA: Insufficient documentation

## 2019-02-17 DIAGNOSIS — R1032 Left lower quadrant pain: Secondary | ICD-10-CM | POA: Diagnosis present

## 2019-02-17 DIAGNOSIS — R109 Unspecified abdominal pain: Secondary | ICD-10-CM | POA: Diagnosis not present

## 2019-02-17 DIAGNOSIS — N201 Calculus of ureter: Secondary | ICD-10-CM

## 2019-02-17 LAB — CBC WITH DIFFERENTIAL/PLATELET
Abs Immature Granulocytes: 0.05 10*3/uL (ref 0.00–0.07)
Basophils Absolute: 0 10*3/uL (ref 0.0–0.1)
Basophils Relative: 0 %
Eosinophils Absolute: 0 10*3/uL (ref 0.0–0.5)
Eosinophils Relative: 0 %
HCT: 47.6 % (ref 39.0–52.0)
Hemoglobin: 15.8 g/dL (ref 13.0–17.0)
Immature Granulocytes: 1 %
Lymphocytes Relative: 8 %
Lymphs Abs: 0.9 10*3/uL (ref 0.7–4.0)
MCH: 32 pg (ref 26.0–34.0)
MCHC: 33.2 g/dL (ref 30.0–36.0)
MCV: 96.6 fL (ref 80.0–100.0)
Monocytes Absolute: 0.6 10*3/uL (ref 0.1–1.0)
Monocytes Relative: 6 %
Neutro Abs: 9.1 10*3/uL — ABNORMAL HIGH (ref 1.7–7.7)
Neutrophils Relative %: 85 %
Platelets: 194 10*3/uL (ref 150–400)
RBC: 4.93 MIL/uL (ref 4.22–5.81)
RDW: 12.2 % (ref 11.5–15.5)
WBC: 10.6 10*3/uL — ABNORMAL HIGH (ref 4.0–10.5)
nRBC: 0 % (ref 0.0–0.2)

## 2019-02-17 LAB — COMPREHENSIVE METABOLIC PANEL
ALT: 25 U/L (ref 0–44)
AST: 24 U/L (ref 15–41)
Albumin: 3.9 g/dL (ref 3.5–5.0)
Alkaline Phosphatase: 54 U/L (ref 38–126)
Anion gap: 13 (ref 5–15)
BUN: 18 mg/dL (ref 6–20)
CO2: 21 mmol/L — ABNORMAL LOW (ref 22–32)
Calcium: 9.2 mg/dL (ref 8.9–10.3)
Chloride: 103 mmol/L (ref 98–111)
Creatinine, Ser: 1.06 mg/dL (ref 0.61–1.24)
GFR calc Af Amer: >60 mL/min (ref >60)
GFR calc non Af Amer: >60 mL/min (ref >60)
Glucose, Bld: 110 mg/dL — ABNORMAL HIGH (ref 70–99)
Potassium: 3.8 mmol/L (ref 3.5–5.1)
Sodium: 137 mmol/L (ref 135–145)
Total Bilirubin: 1.6 mg/dL — ABNORMAL HIGH (ref 0.3–1.2)
Total Protein: 6.8 g/dL (ref 6.5–8.1)

## 2019-02-17 LAB — POCT URINALYSIS DIP (DEVICE)
Bilirubin Urine: NEGATIVE
Glucose, UA: NEGATIVE mg/dL
Ketones, ur: 15 mg/dL — AB
Leukocytes,Ua: NEGATIVE
Nitrite: NEGATIVE
Protein, ur: NEGATIVE mg/dL
Specific Gravity, Urine: 1.015 (ref 1.005–1.030)
Urobilinogen, UA: 0.2 mg/dL (ref 0.0–1.0)
pH: 6 (ref 5.0–8.0)

## 2019-02-17 LAB — I-STAT CHEM 8, ED
BUN: 21 mg/dL — ABNORMAL HIGH (ref 6–20)
Calcium, Ion: 1.16 mmol/L (ref 1.15–1.40)
Chloride: 103 mmol/L (ref 98–111)
Creatinine, Ser: 1 mg/dL (ref 0.61–1.24)
Glucose, Bld: 106 mg/dL — ABNORMAL HIGH (ref 70–99)
HCT: 47 % (ref 39.0–52.0)
Hemoglobin: 16 g/dL (ref 13.0–17.0)
Potassium: 3.8 mmol/L (ref 3.5–5.1)
Sodium: 139 mmol/L (ref 135–145)
TCO2: 24 mmol/L (ref 22–32)

## 2019-02-17 LAB — LIPASE, BLOOD: Lipase: 14 U/L (ref 11–51)

## 2019-02-17 MED ORDER — TAMSULOSIN HCL 0.4 MG PO CAPS: 0.4000 mg | ORAL_CAPSULE | Freq: Every day | ORAL | 0 refills | Status: DC

## 2019-02-17 MED ORDER — KETOROLAC TROMETHAMINE 30 MG/ML IJ SOLN
30.0000 mg | Freq: Once | INTRAMUSCULAR | Status: AC
  Administered 2019-02-17: 16:00:00 30 mg via INTRAVENOUS
  Filled 2019-02-17: qty 1

## 2019-02-17 MED ORDER — HYDROCODONE-ACETAMINOPHEN 5-325 MG PO TABS: 1.0000 | ORAL_TABLET | ORAL | 0 refills | Status: DC | PRN

## 2019-02-17 MED ORDER — SODIUM CHLORIDE 0.9 % IV BOLUS
1000.0000 mL | Freq: Once | INTRAVENOUS | Status: AC
  Administered 2019-02-17: 1000 mL via INTRAVENOUS

## 2019-02-17 MED ORDER — MORPHINE SULFATE (PF) 4 MG/ML IV SOLN
4.0000 mg | Freq: Once | INTRAVENOUS | Status: AC
  Administered 2019-02-17: 14:00:00 4 mg via INTRAVENOUS
  Filled 2019-02-17: qty 1

## 2019-02-17 MED ORDER — HYDROMORPHONE HCL 1 MG/ML IJ SOLN
1.0000 mg | Freq: Once | INTRAMUSCULAR | Status: DC
  Filled 2019-02-17: qty 1

## 2019-02-17 MED ORDER — ONDANSETRON HCL 4 MG/2ML IJ SOLN
4.0000 mg | Freq: Once | INTRAMUSCULAR | Status: AC
  Administered 2019-02-17: 14:00:00 4 mg via INTRAVENOUS
  Filled 2019-02-17: qty 2

## 2019-02-17 NOTE — ED Notes (Signed)
Pt sent to ED for further evaluation and treatment for a kidney stone.  Pt will take himself to ED.  He is A&O w/ VSS and in NAD.  Sent to ED per Alyse Low, [place].

## 2019-02-17 NOTE — ED Notes (Signed)
Pt able to ingest PO fluids without difficulty or complaint. No N/V.  

## 2019-02-17 NOTE — ED Provider Notes (Signed)
[facility] Provider Note   CSN: [id number] Arrival date & time: 02/17/19  1318   History   Chief Complaint Chief Complaint  Patient presents with   poss gallstones   HPI Ryan Rosario is a 53 y.o. male with past medical history significant for hypertension who presents for evaluation of left flank pain.  Patient states he woke up this morning with left-sided flank pain.  Originally began as Investment banker, operational flank however is now radiating into his left abdomen into his left groin.  He has been urinating without difficulty.  He did have one episode of nonbloody diarrhea.  He is able to urinate without difficulty.  Denies fever, chills, nausea, vomiting, chest pain, shortness of breath, constipation, hematuria, penile rashes, lesions, groin swelling or warmth.  He has no past medical history significant for kidney stones or abdominal aneurysms.  Denies additional aggravating or alleviating factors.  He has tried Kaopectate and Tylenol without relief of his symptoms.  He rates his current pain a 10/10.  Patient states pain is intermittent and stabbing.  Patient states he feels like he needs to walk to help with the pain.   History obtained from patient and past medical records.  No interpreter is used.     HPI  Past Medical History:  Diagnosis Date   Hypertension    Leg cramps 2016   hx   Neck pain    hx - pinch nerve    Patient Active Problem List   Diagnosis Date Noted   Essential (primary) hypertension 05/21/2015   Cervical spondylosis without myelopathy 04/09/2015    Past Surgical History:  Procedure Laterality Date   EYE SURGERY Left    [facility] - 1974      Home Medications    Prior to Admission medications   Medication Sig Start Date End Date Taking? Authorizing Provider  DULoxetine (CYMBALTA) 60 MG capsule Take 1 capsule (60 mg total) by mouth daily. 02/04/16   Marcial Pacas, MD  HYDROcodone-acetaminophen (NORCO/VICODIN) 5-325 MG tablet  Take 1-2 tablets by mouth every 4 (four) hours as needed. 02/17/19   Fronnie Urton A, PA-C  lisinopril (PRINIVIL,ZESTRIL) 10 MG tablet TAKE ONE TABLET BY MOUTH TWICE DAILY 02/22/16   Marcial Pacas, MD  tamsulosin (FLOMAX) 0.4 MG CAPS capsule Take 1 capsule (0.4 mg total) by mouth daily. 02/17/19   Katlynn Naser A, PA-C    Family History Family History  Problem Relation Age of Onset   Hypertension Mother    Aneurysm Mother    Hypertension Father    Diabetes Father    Congestive Heart Failure Father    Colon cancer Neg Hx    Colon polyps Neg Hx    Esophageal cancer Neg Hx    Rectal cancer Neg Hx    Stomach cancer Neg Hx     Social History Social History   Tobacco Use   Smoking status: Never Smoker   Smokeless tobacco: Never Used  Substance Use Topics   Alcohol use: No    Alcohol/week: 0.0 standard drinks    Comment: No use since 1983   Drug use: No     Allergies   Patient has no known allergies.   Review of Systems Review of Systems  Constitutional: Negative.   HENT: Negative.   Eyes: Negative.   Respiratory: Negative.   Cardiovascular: Negative.   Gastrointestinal: Positive for abdominal pain and diarrhea (1 episode). Negative for abdominal distention, anal bleeding, blood in stool, constipation, nausea, rectal pain and vomiting.  Genitourinary: Positive for flank pain. Negative for decreased urine volume, difficulty urinating, discharge, dysuria, frequency, genital sores, hematuria, penile pain, penile swelling, scrotal swelling, testicular pain and urgency.  Neurological: Negative.   All other systems reviewed and are negative.  Physical Exam Updated Vital Signs BP 131/75 (BP Location: Right Arm)    Pulse (!) 55    Temp 98.4 F (36.9 C) (Oral)    Resp 16    Ht  (1.676 m)    Wt 68 kg    SpO2 99%    BMI 24.21 kg/m   Physical Exam Vitals signs and nursing note reviewed.  Constitutional:      General: He is not in acute distress.     Appearance: He is well-developed. He is not ill-appearing, toxic-appearing or diaphoretic.  HENT:     Head: Normocephalic and atraumatic.     Nose: Nose normal.     Mouth/Throat:     Mouth: Mucous membranes are moist.     Pharynx: Oropharynx is clear.  Eyes:     Pupils: Pupils are equal, round, and reactive to light.  Neck:     Musculoskeletal: Normal range of motion and neck supple.  Cardiovascular:     Rate and Rhythm: Normal rate and regular rhythm.     Pulses: Normal pulses.     Heart sounds: Normal heart sounds.  Pulmonary:     Effort: Pulmonary effort is normal. No respiratory distress.     Breath sounds: Normal breath sounds.  Abdominal:     General: Bowel sounds are normal. There is no distension.     Palpations: Abdomen is soft.     Tenderness: There is abdominal tenderness in the left upper quadrant and left lower quadrant. There is no guarding or rebound. Negative signs include Murphy's sign and McBurney's sign.     Hernia: No hernia is present.     Comments: Soft without rebound or guarding.  There is some mild tenderness to his left upper and lower quadrants.  Nonsurgical abdomen.  No evidence of abdominal wall herniations.  Mild tenderness to left flank however negative CVA tap.  Musculoskeletal: Normal range of motion.     Comments: Moves all 4 extremities without difficulty.  Skin:    General: Skin is warm and dry.     Comments: Brisk capillary refill.  No rashes or lesions.  Neurological:     Mental Status: He is alert.     Comments: Ambulatory in room without difficulty.  No facial droop.  Cranial nerves II through XII grossly intact.    ED Treatments / Results  Labs (all labs ordered are listed, but only abnormal results are displayed) Labs Reviewed  CBC WITH DIFFERENTIAL/PLATELET - Abnormal; Notable for the following components:      Result Value   WBC 10.6 (*)    Neutro Abs 9.1 (*)    All other components within normal limits  COMPREHENSIVE METABOLIC  PANEL - Abnormal; Notable for the following components:   CO2 21 (*)    Glucose, Bld 110 (*)    Total Bilirubin 1.6 (*)    All other components within normal limits  I-STAT CHEM 8, ED - Abnormal; Notable for the following components:   BUN 21 (*)    Glucose, Bld 106 (*)    All other components within normal limits  LIPASE, BLOOD  Urinalysis from UC 13:07 Glucose, UA NEGATIVE mg/dL NEGATIVE   Bilirubin Urine NEGATIVE NEGATIVE   Ketones, ur NEGATIVE mg/dL [id number]  Specific Gravity, Urine 1.005 - 1.030 1.015   Hgb urine dipstick NEGATIVE TRACEAbnormal    pH 5.0 - 8.0 6.0   Protein, ur NEGATIVE mg/dL NEGATIVE   Urobilinogen, UA 0.0 - 1.0 mg/dL 0.2   Nitrite NEGATIVE NEGATIVE   Leukocytes,Ua NEGATIVE NEGATIVE     EKG None  Radiology Ct Renal Stone Study  Result Date: 02/17/2019 CLINICAL DATA:  Flank pain, stone disease suspected, left lower abdominal and left flank pain radiating into groin EXAM: CT ABDOMEN AND PELVIS WITHOUT CONTRAST TECHNIQUE: Multidetector CT imaging of the abdomen and pelvis was performed following the standard protocol without IV contrast. COMPARISON:  None. FINDINGS: Lower chest: No acute abnormality. Hepatobiliary: No solid liver abnormality is seen. No gallstones, gallbladder wall thickening, or biliary dilatation. Pancreas: Unremarkable. No pancreatic ductal dilatation or surrounding inflammatory changes. Spleen: Normal in size without significant abnormality. Adrenals/Urinary Tract: Adrenal glands are unremarkable. There is a 4 mm calculus of the proximal third of the left ureter with mild associated left hydronephrosis and hydroureter. There are numerous additional tiny bilateral renal calculi. Bladder is unremarkable. Stomach/Bowel: Stomach is within normal limits. Appendix appears normal. No evidence of bowel wall thickening, distention, or inflammatory changes. Vascular/Lymphatic: No significant vascular findings are present. No enlarged abdominal or pelvic  lymph nodes. Reproductive: No mass or other significant abnormality. Other: No abdominal wall hernia or abnormality. No abdominopelvic ascites. Musculoskeletal: No acute or significant osseous findings. IMPRESSION: There is a 4 mm calculus of the proximal third of the left ureter with mild associated left hydronephrosis and hydroureter. There are numerous additional tiny bilateral renal calculi. Electronically Signed   By: Lauralyn PrimesAlex  Bibbey M.D.   On: 02/17/2019 15:02    Procedures Procedures (including critical care time)  Medications Ordered in ED Medications  morphine 4 MG/ML injection 4 mg (4 mg Intravenous Given 02/17/19 1419)  ondansetron (ZOFRAN) injection 4 mg (4 mg Intravenous Given 02/17/19 1417)  sodium chloride 0.9 % bolus 1,000 mL (1,000 mLs Intravenous Bolus from Bag 02/17/19 1414)  ketorolac (TORADOL) 30 MG/ML injection 30 mg (30 mg Intravenous Given 02/17/19 1537)   Initial Impression / Assessment and Plan / ED Course  I have reviewed the triage vital signs and the nursing notes.  Pertinent labs & imaging results that were available during my care of the patient were reviewed by me and considered in my medical decision making (see chart for details).  53 year old male appears otherwise well presents for evaluation of left-sided flank pain.  He is afebrile, nonseptic, non-ill-appearing.  Seen by urgent care and sent to emergency department for CT scan to rule out stone.  He had a urinalysis there was positive for blood however negative for infection.  Tenderness palpation to left abdominal wall however no rebound or guarding.  Minimal tenderness to left flank.  Plan for labs, imaging, pain management and reevaluate.  Labs and imaging personally reviewed: CBC with mild leukocytosis at 10.6 I-STAT Chem-8 with creatinine 1.00 Urinalysis from UC in Epic. Negative for infection, small blood CT Stone with 4mm calculus of the proximal third of the left ureter with mild associated left  hydronephrosis and hydroureter.  1510: Patient with significant improvement in pain in ED.  He is texting on phone. Discussed with patient CT findings with patient and family in room.  Discussed p.o. challenge, pain meds, Flomax at home and follow-up with urology which he is in agreement with.    1610: Patient to tolerate PO challenge without difficulty. There is no evidence of significant hydronephrosis,  serum creatine WNL, vitals sign stable and the pt does not have irratractable vomiting. Pt will be dc home with pain medications & has been advised to follow up with PCP.   The patient has been appropriately medically screened and/or stabilized in the ED. I have low suspicion for any other emergent medical condition which would require further screening, evaluation or treatment in the ED or require inpatient management.  The patient has been appropriately medically screened and/or stabilized in the ED. I have low suspicion for any other emergent medical condition which would require further screening, evaluation or treatment in the ED or require inpatient management.     Final Clinical Impressions(s) / ED Diagnoses   Final diagnoses:  Ureteral stone    ED Discharge Orders         Ordered    HYDROcodone-acetaminophen (NORCO/VICODIN) 5-325 MG tablet  Every 4 hours PRN     02/17/19 1547    tamsulosin (FLOMAX) 0.4 MG CAPS capsule  Daily     02/17/19 1547           Shaddai Shapley A, PA-C 02/17/19 1614    Gwyneth Sprout, MD 02/20/19 1531

## 2019-02-17 NOTE — ED Notes (Signed)
Patient verbalizes understanding of discharge instructions. Opportunity for questioning and answers were provided. Armband removed by staff, pt discharged from ED.  

## 2019-02-17 NOTE — ED Provider Notes (Signed)
[facility]    CSN: [id number] Arrival date & time: 02/17/19  1242      History   Chief Complaint Chief Complaint  Patient presents with  . Abdominal Pain  . Back Pain  . Diarrhea    HPI Ryan Rosario is a 53 y.o. male.   The history is provided by the patient. No language interpreter was used.  Abdominal Pain Pain location:  L flank Pain quality: aching   Pain radiates to:  Does not radiate Onset quality:  Gradual Timing:  Constant Progression:  Worsening Chronicity:  New Relieved by:  Nothing Worsened by:  Nothing Ineffective treatments:  None tried Associated symptoms: diarrhea   Back Pain Associated symptoms: abdominal pain   Diarrhea Associated symptoms: abdominal pain    Pt reports sudden onset of pain today  Pain in left back   Past Medical History:  Diagnosis Date  . Hypertension   . Leg cramps 2016   hx  . Neck pain    hx - pinch nerve    Patient Active Problem List   Diagnosis Date Noted  . Essential (primary) hypertension 05/21/2015  . Cervical spondylosis without myelopathy 04/09/2015    Past Surgical History:  Procedure Laterality Date  . EYE SURGERY Left    [facility] - 1974       Home Medications    Prior to Admission medications   Medication Sig Start Date End Date Taking? Authorizing Provider  DULoxetine (CYMBALTA) 60 MG capsule Take 1 capsule (60 mg total) by mouth daily. 02/04/16   Levert Feinstein, MD  HYDROcodone-acetaminophen (NORCO) 7.5-325 MG tablet Take 1 tablet by mouth every 6 (six) hours as needed for moderate pain (Tooth Pain/Dental Surgery).    [provider]  lisinopril (PRINIVIL,ZESTRIL) 10 MG tablet TAKE ONE TABLET BY MOUTH TWICE DAILY 02/22/16   Levert Feinstein, MD    Family History Family History  Problem Relation Age of Onset  . Hypertension Mother   . Aneurysm Mother   . Hypertension Father   . Diabetes Father   . Congestive Heart Failure Father   . Colon cancer Neg Hx   . Colon polyps Neg Hx    . Esophageal cancer Neg Hx   . Rectal cancer Neg Hx   . Stomach cancer Neg Hx     Social History Social History   Tobacco Use  . Smoking status: Never Smoker  . Smokeless tobacco: Never Used  Substance Use Topics  . Alcohol use: No    Alcohol/week: 0.0 standard drinks    Comment: No use since 1983  . Drug use: No     Allergies   Patient has no known allergies.   Review of Systems Review of Systems  Gastrointestinal: Positive for abdominal pain and diarrhea.  Musculoskeletal: Positive for back pain.  All other systems reviewed and are negative.    Physical Exam Triage Vital Signs ED Triage Vitals  Enc Vitals Group     BP 02/17/19 1300 134/78     Pulse Rate 02/17/19 1300 61     Resp 02/17/19 1300 (!) 24     Temp 02/17/19 1300 98.1 F (36.7 C)     Temp src --      SpO2 02/17/19 1300 100 %     Weight --      Height --      Head Circumference --      Peak Flow --      Pain Score 02/17/19 1302 10  Pain Loc --      Pain Edu? --      Excl. in [place]? --    No data found.  Updated Vital Signs BP 134/78   Pulse 61   Temp 98.1 F (36.7 C)   Resp (!) 24   SpO2 100%   Visual Acuity Right Eye Distance:   Left Eye Distance:   Bilateral Distance:    Right Eye Near:   Left Eye Near:    Bilateral Near:     Physical Exam Vitals signs and nursing note reviewed.  Constitutional:      Appearance: He is well-developed.  HENT:     Head: Normocephalic and atraumatic.  Eyes:     Conjunctiva/sclera: Conjunctivae normal.  Neck:     Musculoskeletal: Neck supple.  Cardiovascular:     Rate and Rhythm: Normal rate and regular rhythm.     Heart sounds: No murmur.  Pulmonary:     Effort: Pulmonary effort is normal. No respiratory distress.     Breath sounds: Normal breath sounds.  Abdominal:     General: Bowel sounds are normal.     Palpations: Abdomen is soft.     Tenderness: There is no abdominal tenderness.  Skin:    General: Skin is warm and dry.   Neurological:     General: No focal deficit present.     Mental Status: He is alert.  Psychiatric:        Mood and Affect: Mood normal.      UC Treatments / Results  Labs (all labs ordered are listed, but only abnormal results are displayed) Labs Reviewed  POCT URINALYSIS DIP (DEVICE) - Abnormal; Notable for the following components:      Result Value   Ketones, ur 15 (*)    Hgb urine dipstick TRACE (*)    All other components within normal limits    EKG   Radiology No results found.  Procedures Procedures (including critical care time)  Medications Ordered in UC Medications - No data to display  Initial Impression / Assessment and Plan / UC Course  I have reviewed the triage vital signs and the nursing notes.  Pertinent labs & imaging results that were available during my care of the patient were reviewed by me and considered in my medical decision making (see chart for details).     MDM   Pt very uncomfortable. Urine positive for blood  I suspect he has a kidney stone.  Pt sent to ED for evaluation  Final Clinical Impressions(s) / UC Diagnoses   Final diagnoses:  Acute left flank pain   Discharge Instructions   None    ED Prescriptions    None     PDMP not reviewed this encounter.   Fransico Meadow, [place] 02/17/19 1319

## 2019-02-17 NOTE — ED Triage Notes (Signed)
C/O gradual onset left lower abd pain and left flank pain radiating down into left groin this AM.  States pain worse with movement and deep breathing and sometimes palpation.  C/O nausea, no vomiting.  Has had diarrhea.  Reports taking 2 Kaopectate this AM.  Denies fever.  Pt restless.

## 2019-02-17 NOTE — Discharge Instructions (Addendum)
Take the pain medicine as prescribed.  Do not drive, operate heavy machinery or make life decisions while taking this medicine.  I have also prescribed with Flomax.  This medication may lower your blood pressure.  Please be careful when going from sitting to standing.  I would recommend dangling her feet on the edge of the bed for a few minutes before you begin to walk.  Would also recommend increasing your fluids to help pass the stone.  If you have any additional concerns you may follow-up with urology.  The contact information is listed on your discharge paperwork.  If you develop severe, worsening pain uncontrolled with the pain medicine at home please reevaluation the emergency department.

## 2019-02-17 NOTE — ED Notes (Signed)
Strainer and urinal provided upon discharge.

## 2019-02-17 NOTE — ED Notes (Signed)
Patient transported to CT 

## 2019-06-20 ENCOUNTER — Ambulatory Visit: Payer: BC Managed Care – PPO | Attending: Family

## 2019-06-20 DIAGNOSIS — Z23 Encounter for immunization: Secondary | ICD-10-CM

## 2019-06-20 NOTE — Progress Notes (Signed)
   Covid-19 Vaccination Clinic  Name:  Ryan Rosario    MRN: [id number] DOB: 02-Nov-1965  06/20/2019  [name] was observed post Covid-19 immunization for 15 minutes without incident. He was provided with Vaccine Information Sheet and instruction to access the V-Safe system.   [name] was instructed to call 911 with any severe reactions post vaccine: Marland Kitchen Difficulty breathing  . Swelling of face and throat  . A fast heartbeat  . A bad rash all over body  . Dizziness and weakness   Immunizations Administered    Name Date Dose VIS Date Route   Moderna COVID-19 Vaccine 06/20/2019  3:05 PM 0.5 mL 03/12/2019 Intramuscular   Manufacturer: Moderna   Lot: [id number]   NDC: 68257-493-55

## 2019-07-18 ENCOUNTER — Ambulatory Visit: Payer: BC Managed Care – PPO | Attending: Family

## 2019-07-18 DIAGNOSIS — Z23 Encounter for immunization: Secondary | ICD-10-CM

## 2019-07-18 NOTE — Progress Notes (Signed)
   Covid-19 Vaccination Clinic  Name:  Ryan Rosario    MRN: [id number] DOB: July 24, 1965  07/18/2019  Mr. [name] was observed post Covid-19 immunization for 15 minutes without incident. He was provided with Vaccine Information Sheet and instruction to access the V-Safe system.   Mr. [name] was instructed to call 911 with any severe reactions post vaccine: Marland Kitchen Difficulty breathing  . Swelling of face and throat  . A fast heartbeat  . A bad rash all over body  . Dizziness and weakness   Immunizations Administered    Name Date Dose VIS Date Route   Moderna COVID-19 Vaccine 07/18/2019  4:23 PM 0.5 mL 03/12/2019 Intramuscular   Manufacturer: Moderna   Lot: [id number]   NDC: 96283-662-94

## 2019-07-23 ENCOUNTER — Ambulatory Visit: Payer: BC Managed Care – PPO

## 2020-07-07 IMAGING — CT CT RENAL STONE PROTOCOL
2 of 4 series · 16 of 46 positions shown, 18 images · non-contrast
Comparison: None.

CLINICAL DATA: Flank pain, stone disease suspected, left lower
abdominal and left flank pain radiating into groin

EXAM:
CT ABDOMEN AND PELVIS WITHOUT CONTRAST
TECHNIQUE: Multidetector CT imaging of the abdomen and pelvis was performed
following the standard protocol without IV contrast.

[Series 3: ap without · axial · non-contrast · 0.69mm/px · z∈[-529,-159]mm · 13 of 84 slices shown, 15 images]
[im 5/84  soft-tissue]
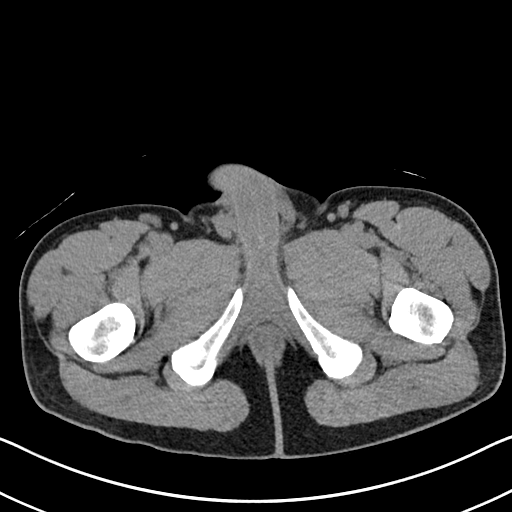
[im 5/84  bone]
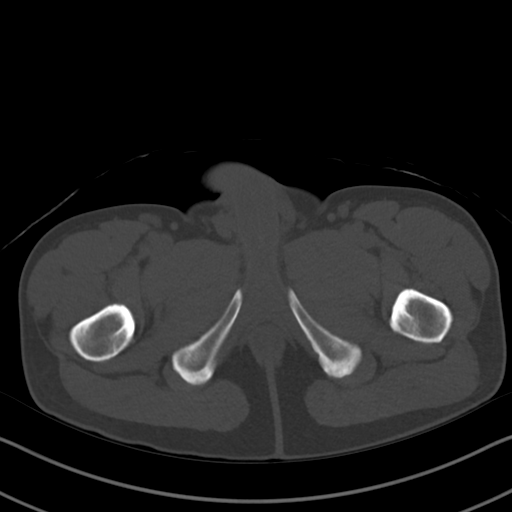
[im 14/84  soft-tissue]
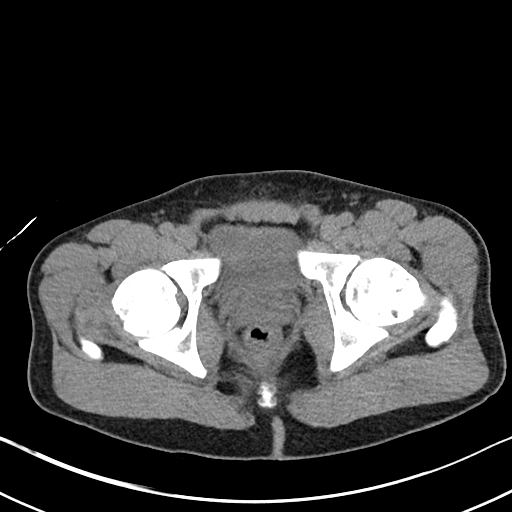
[im 18/84  soft-tissue]
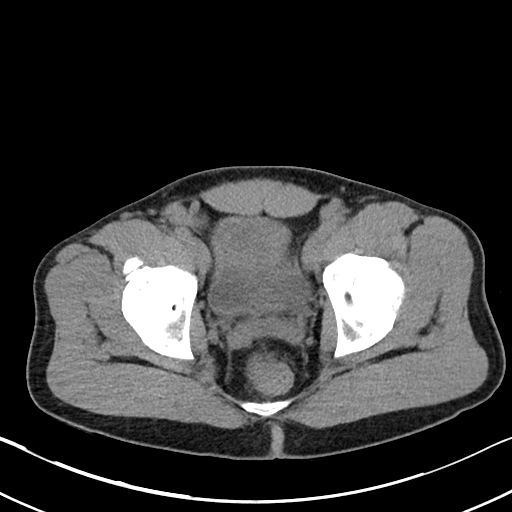
[im 22/84  soft-tissue]
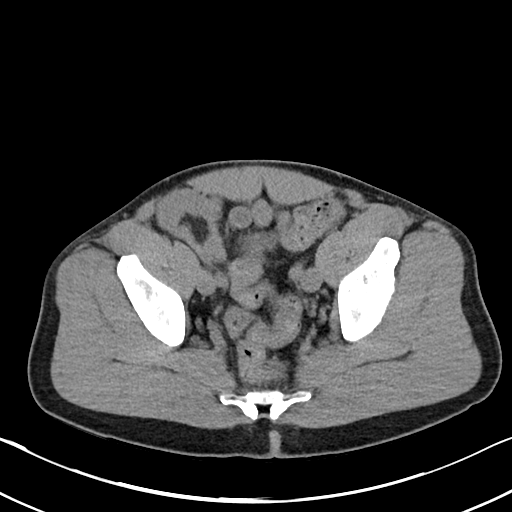
[im 31/84  soft-tissue]
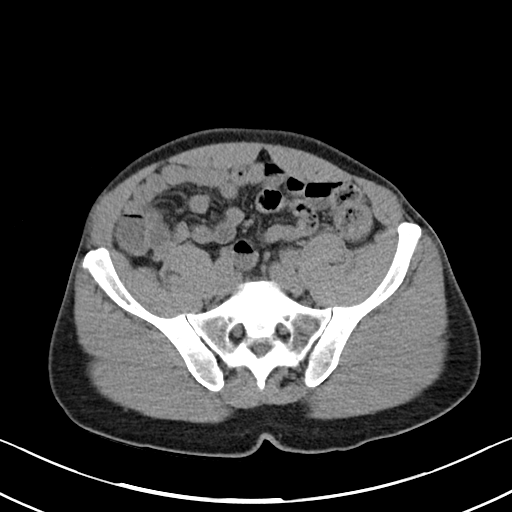
[im 35/84  soft-tissue]
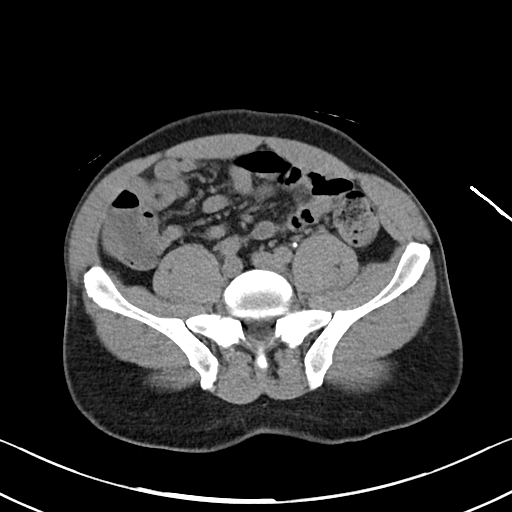
[im 44/84  soft-tissue]
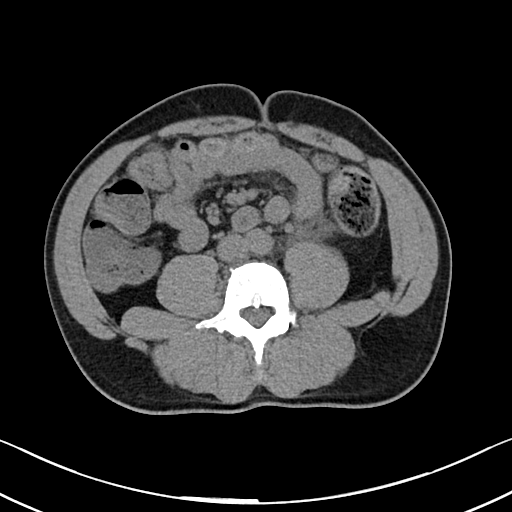
[im 49/84  soft-tissue]
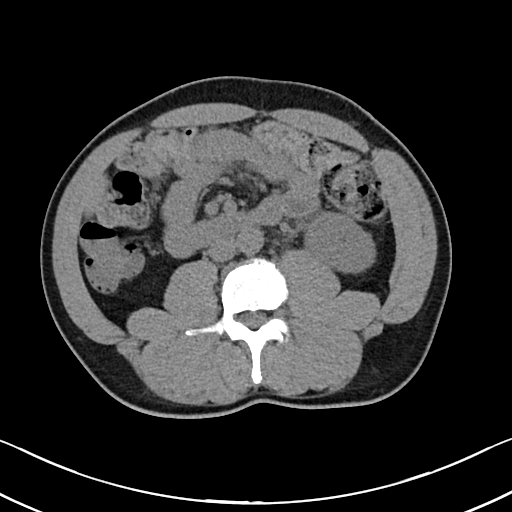
[im 53/84  soft-tissue]
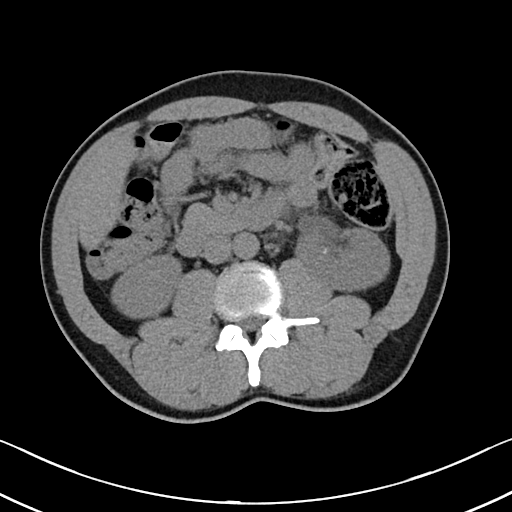
[im 53/84  bone]
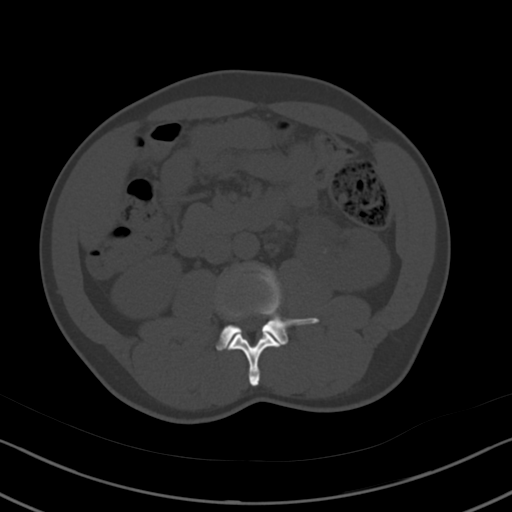
[im 62/84  soft-tissue]
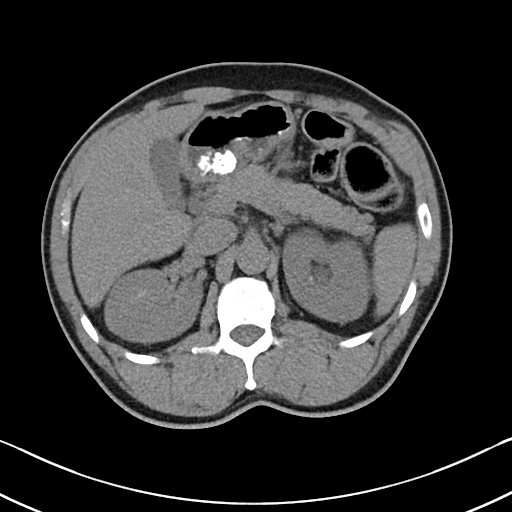
[im 66/84  soft-tissue]
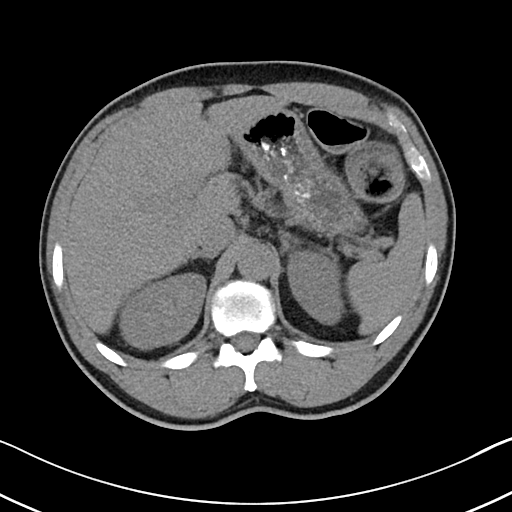
[im 70/84  soft-tissue]
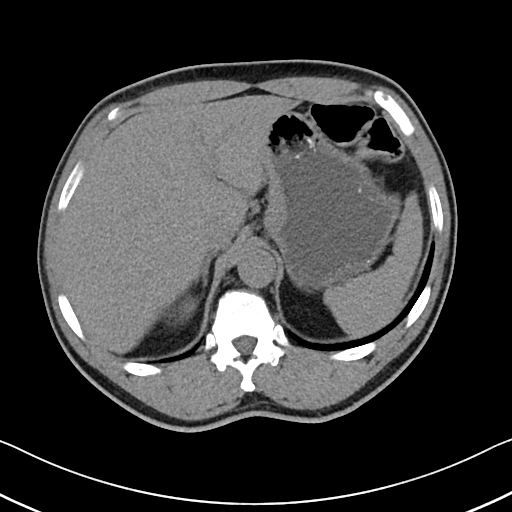
[im 79/84  soft-tissue]
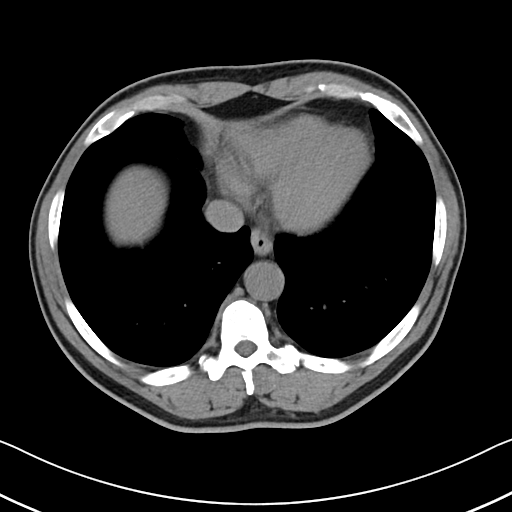

[Series 6: cor · coronal · 0.67mm/px · 3 of 94 slices shown]
[im 32/94  soft-tissue]
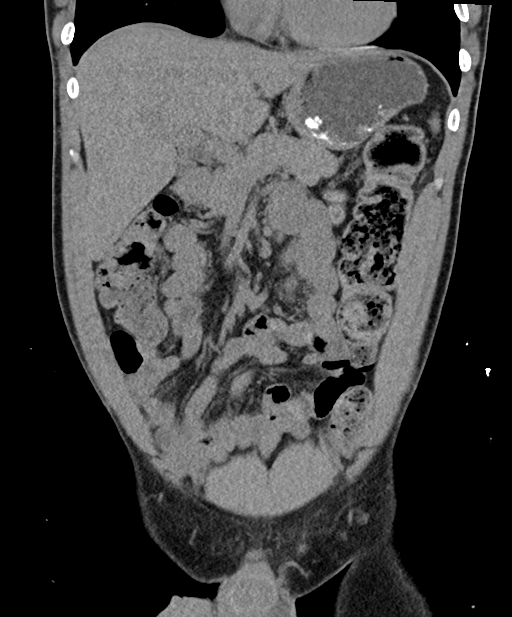
[im 42/94  soft-tissue]
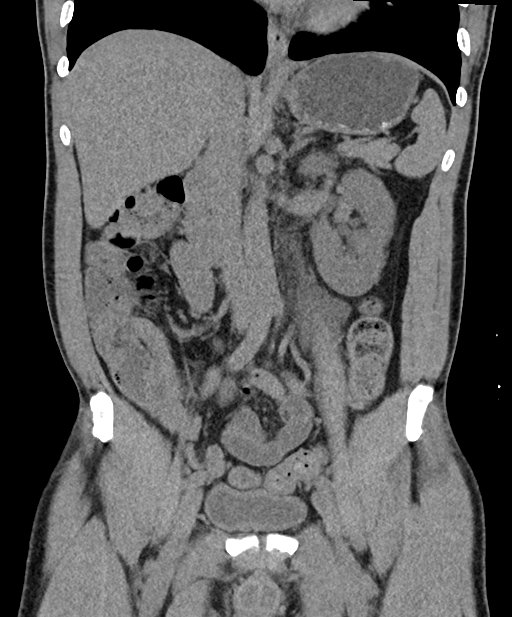
[im 52/94  soft-tissue]
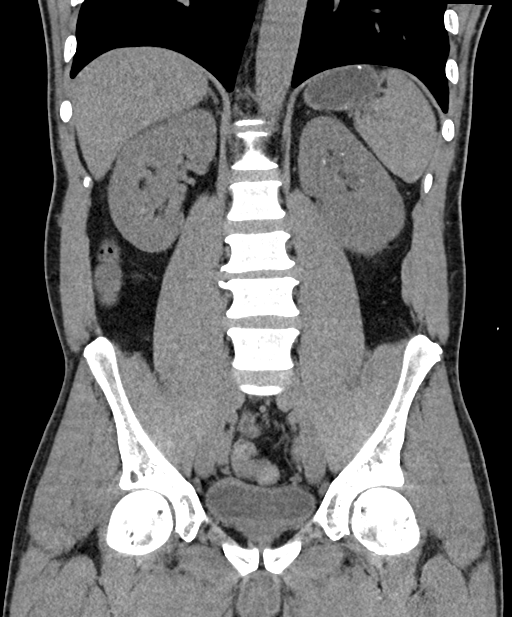

[16 of 46 positions shown; findings below may reference images not displayed]

FINDINGS: Lower chest: No acute abnormality.

Hepatobiliary: No solid liver abnormality is seen. No gallstones,
gallbladder wall thickening, or biliary dilatation.

Pancreas: Unremarkable. No pancreatic ductal dilatation or
surrounding inflammatory changes.

Spleen: Normal in size without significant abnormality.

Adrenals/Urinary Tract: Adrenal glands are unremarkable. There is a
4 mm calculus of the proximal third of the left ureter with mild
associated left hydronephrosis and hydroureter. There are numerous
additional tiny bilateral renal calculi. Bladder is unremarkable.

Stomach/Bowel: Stomach is within normal limits. Appendix appears
normal. No evidence of bowel wall thickening, distention, or
inflammatory changes.

Vascular/Lymphatic: No significant vascular findings are present. No
enlarged abdominal or pelvic lymph nodes.

Reproductive: No mass or other significant abnormality.

Other: No abdominal wall hernia or abnormality. No abdominopelvic
ascites.

Musculoskeletal: No acute or significant osseous findings.
IMPRESSION: There is a 4 mm calculus of the proximal third of the left ureter
with mild associated left hydronephrosis and hydroureter. There are
numerous additional tiny bilateral renal calculi.

## 2020-10-28 DIAGNOSIS — H3553 Other dystrophies primarily involving the sensory retina: Secondary | ICD-10-CM | POA: Diagnosis not present

## 2020-10-28 DIAGNOSIS — H43811 Vitreous degeneration, right eye: Secondary | ICD-10-CM | POA: Diagnosis not present

## 2021-04-13 ENCOUNTER — Encounter (HOSPITAL_BASED_OUTPATIENT_CLINIC_OR_DEPARTMENT_OTHER): Payer: Self-pay | Admitting: Family Medicine

## 2021-04-13 ENCOUNTER — Other Ambulatory Visit: Payer: Self-pay

## 2021-04-13 ENCOUNTER — Ambulatory Visit (HOSPITAL_BASED_OUTPATIENT_CLINIC_OR_DEPARTMENT_OTHER): Payer: 59 | Admitting: Family Medicine

## 2021-04-13 VITALS — BP 172/114 | HR 69 | Ht 66.0 in | Wt 157.7 lb

## 2021-04-13 DIAGNOSIS — E785 Hyperlipidemia, unspecified: Secondary | ICD-10-CM

## 2021-04-13 DIAGNOSIS — I10 Essential (primary) hypertension: Secondary | ICD-10-CM

## 2021-04-13 HISTORY — DX: Hyperlipidemia, unspecified: E78.5

## 2021-04-13 LAB — CBC WITH DIFFERENTIAL/PLATELET
Basophils Absolute: 0 10*3/uL (ref 0.0–0.2)
Basos: 1 %
EOS (ABSOLUTE): 0.1 10*3/uL (ref 0.0–0.4)
Eos: 3 %
Hematocrit: 50.4 % (ref 37.5–51.0)
Hemoglobin: 17.2 g/dL (ref 13.0–17.7)
Immature Grans (Abs): 0 10*3/uL (ref 0.0–0.1)
Immature Granulocytes: 0 %
Lymphocytes Absolute: 2.2 10*3/uL (ref 0.7–3.1)
Lymphs: 39 %
MCH: 31.4 pg (ref 26.6–33.0)
MCHC: 34.1 g/dL (ref 31.5–35.7)
MCV: 92 fL (ref 79–97)
Monocytes Absolute: 0.3 10*3/uL (ref 0.1–0.9)
Monocytes: 6 %
Neutrophils Absolute: 2.9 10*3/uL (ref 1.4–7.0)
Neutrophils: 51 %
Platelets: 257 10*3/uL (ref 150–450)
RBC: 5.48 x10E6/uL (ref 4.14–5.80)
RDW: 12.9 % (ref 11.6–15.4)
WBC: 5.5 10*3/uL (ref 3.4–10.8)

## 2021-04-13 LAB — LIPID PANEL
Chol/HDL Ratio: 4.9 ratio (ref 0.0–5.0)
Cholesterol, Total: 310 mg/dL — ABNORMAL HIGH (ref 100–199)
HDL: 63 mg/dL (ref >39)
LDL Chol Calc (NIH): 234 mg/dL — ABNORMAL HIGH (ref 0–99)
Triglycerides: 82 mg/dL (ref 0–149)
VLDL Cholesterol Cal: 13 mg/dL (ref 5–40)

## 2021-04-13 LAB — COMPREHENSIVE METABOLIC PANEL
ALT: 35 IU/L (ref 0–44)
AST: 27 IU/L (ref 0–40)
Albumin/Globulin Ratio: 1.8 (ref 1.2–2.2)
Albumin: 4.8 g/dL (ref 3.8–4.9)
Alkaline Phosphatase: 81 IU/L (ref 44–121)
BUN/Creatinine Ratio: 11 (ref 9–20)
BUN: 11 mg/dL (ref 6–24)
Bilirubin Total: 0.9 mg/dL (ref 0.0–1.2)
CO2: 25 mmol/L (ref 20–29)
Calcium: 10.6 mg/dL — ABNORMAL HIGH (ref 8.7–10.2)
Chloride: 99 mmol/L (ref 96–106)
Creatinine, Ser: 1.01 mg/dL (ref 0.76–1.27)
Globulin, Total: 2.7 g/dL (ref 1.5–4.5)
Glucose: 87 mg/dL (ref 70–99)
Potassium: 4.4 mmol/L (ref 3.5–5.2)
Sodium: 138 mmol/L (ref 134–144)
Total Protein: 7.5 g/dL (ref 6.0–8.5)
eGFR: 87 mL/min/{1.73_m2} (ref >59)

## 2021-04-13 LAB — HEMOGLOBIN A1C
Est. average glucose Bld gHb Est-mCnc: 120 mg/dL
Hgb A1c MFr Bld: 5.8 % — ABNORMAL HIGH (ref 4.8–5.6)

## 2021-04-13 MED ORDER — LISINOPRIL 10 MG PO TABS: 10.0000 mg | ORAL_TABLET | Freq: Every day | ORAL | 0 refills | Status: DC

## 2021-04-13 NOTE — Assessment & Plan Note (Signed)
Blood pressure elevated in office today, given elevation and history of hypertension, will proceed with restarting lisinopril at 10 mg daily Recommend monitoring blood pressure at home at least 3-4 times per week Recommend DASH diet, handout provided Recommend regular moderate intensity aerobic exercise Plan for follow-up in about 2 to 3 weeks to monitor progress with above, likely repeat BMP to assess electrolytes and kidney function related to ACE inhibitor initiation

## 2021-04-13 NOTE — Assessment & Plan Note (Signed)
We will check lipid panel to reassess status Discussed general lifestyle modifications related to hyperlipidemia Consider restarting statin therapy once labs checked

## 2021-04-13 NOTE — Patient Instructions (Signed)
°  Medication Instructions:  Your physician has recommended you make the following change in your medication:  -- START Lisinopril 10 mg - Take 1 tablet by mouth daily --If you need a refill on any your medications before your next appointment, please call your pharmacy first. If no refills are authorized on file call the office.-- Lab Work: Your physician has recommended that you have lab work today: CBC, CMP, Lipid, and A1C If you have labs (blood work) drawn today and your tests are completely normal, you will receive your results via MyChart message OR a phone call from our staff.  Please ensure you check your voicemail in the event that you authorized detailed messages to be left on a delegated number. If you have any lab test that is abnormal or we need to change your treatment, we will call you to review the results.  Follow-Up: Your next appointment:   Your physician recommends that you schedule a follow-up appointment in: 2-3 WEEKS with Dr. de [place]  You will receive a text message or e-mail with a link to a survey about your care and experience with [place] today! We would greatly appreciate your feedback!   Thanks for letting us be apart of your health journey!!  Primary Care and Sports Medicine   Dr. Ceasar Mons [place]   We encourage you to activate your patient portal called "MyChart".  Sign up information is provided on this After Visit Summary.  MyChart is used to connect with patients for Virtual Visits (Telemedicine).  Patients are able to view lab/test results, encounter notes, upcoming appointments, etc.  Non-urgent messages can be sent to your provider as well. To learn more about what you can do with MyChart, please visit --  [email or web address].

## 2021-04-13 NOTE — Progress Notes (Signed)
New Patient Office Visit  Subjective:  Patient ID: Ryan Rosario, male    DOB: 02/19/1966  Age: 56 y.o. MRN: [id number]  CC:  Chief Complaint  Patient presents with   Establish Care    Former PCP Dr. Glenetta Borg (notes in Epic)   Joint issues    Patient states that when he uses his left arm and left knee her hears and feels a crunching noise. He denies any associated pain with the noise and feeling   Pinch Nerve    Patient states he has been diagnosed with a pinched nerve in the cervical region of his spine. He was seeing neurology years ago but has not been recently. He states that present day he is having numbness and feelings of hot and cold in his arms and forearms. At time he has a cold sensation is his fingers.    Sciatica    Patient states he is also experiencing lumbar pain with feelings of radiating pain down his left leg. He states at time he has to use an ace bandage wrap on his left leg while working to try to dull the nerve pains.     HPI Ryan Rosario is a 56 year old male presenting to establish in clinic.  He has current concerns as outlined above.  Past medical history is significant for hypertension, cervical spondylosis, hyperlipidemia.  Hypertension: Reports being on lisinopril in the past.  Chart review also indicates that patient was on chlorthalidone at 1 point.  He indicates that he stopped lisinopril as he felt that he was "not seeing any benefit".  Indicates that at present he will occasionally have some headaches.  Denies any issues with chest pain, lightheadedness or dizziness.  Does not check blood pressure regularly.  Reports that over the past couple months he has not been eating as well as he should.  Hyperlipidemia: On chart review, patient was prescribed atorvastatin in the past, although it seems patient did not start taking this.  Cervical spondylosis: Has seen neurology in the past, tried gabapentin, duloxetine.  There was a recommendation for evaluation  with neurosurgery, although it does not seem that patient never established with a neurosurgeon per his report.  Patient is originally from [place].  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  Outside of work he enjoys playing video games, watching TV.  Does plan to start an exercise regimen soon.  Past Medical History:  Diagnosis Date   Hyperlipidemia 04/13/2021   Hypertension    Leg cramps 04/11/2014   hx   Neck pain    hx - pinch nerve    Past Surgical History:  Procedure Laterality Date   EYE SURGERY Left    [facility] - 1974    Family History  Problem Relation Age of Onset   Hypertension Mother    Aneurysm Mother    Kidney disease Father    Hypertension Father    Diabetes Father    Congestive Heart Failure Father    Cancer Sister    Cancer Maternal Grandmother        lung   Diabetes Maternal Grandfather    Colon cancer Neg Hx    Colon polyps Neg Hx    Esophageal cancer Neg Hx    Rectal cancer Neg Hx    Stomach cancer Neg Hx     Social History   Socioeconomic History   Marital status: Divorced    Spouse name: Not on file   Number of children: 1  Years of education: BS degree   Highest education level: Not on file  Occupational History   Occupation: Physiological scientist  Tobacco Use   Smoking status: Never   Smokeless tobacco: Never  Vaping Use   Vaping Use: Never used  Substance and Sexual Activity   Alcohol use: No    Comment: No use since 1983   Drug use: No   Sexual activity: Not Currently  Other Topics Concern   Not on file  Social History Narrative   Right-handed.   1-2 cups caffeine per day.   Social Determinants of Health   Financial Resource Strain: Not on file  Food Insecurity: Not on file  Transportation Needs: Not on file  Physical Activity: Not on file  Stress: Not on file  Social Connections: Not on file  Intimate Partner Violence: Not on file    Objective:   Today's Vitals: BP (!) 172/114    Pulse 69     Ht 5\' 6"  (1.676 m)    Wt 157 lb 11.2 oz (71.5 kg)    SpO2 99%    BMI 25.45 kg/m   Physical Exam  56 year old male in no acute distress Cardiovascular exam with regular rate and rhythm, no murmurs appreciated Lungs clear to auscultation bilaterally  Assessment & Plan:   Problem List Items Addressed This Visit       Cardiovascular and Mediastinum   Essential (primary) hypertension - Primary    Blood pressure elevated in office today, given elevation and history of hypertension, will proceed with restarting lisinopril at 10 mg daily Recommend monitoring blood pressure at home at least 3-4 times per week Recommend DASH diet, handout provided Recommend regular moderate intensity aerobic exercise Plan for follow-up in about 2 to 3 weeks to monitor progress with above, likely repeat BMP to assess electrolytes and kidney function related to ACE inhibitor initiation      Relevant Medications   lisinopril (ZESTRIL) 10 MG tablet   Other Relevant Orders   CBC with Differential/Platelet   Comprehensive metabolic panel   Hemoglobin A1c     Other   Hyperlipidemia    We will check lipid panel to reassess status Discussed general lifestyle modifications related to hyperlipidemia Consider restarting statin therapy once labs checked      Relevant Medications   lisinopril (ZESTRIL) 10 MG tablet   Other Relevant Orders   Lipid panel    Outpatient Encounter Medications as of 04/13/2021  Medication Sig   lisinopril (ZESTRIL) 10 MG tablet Take 1 tablet (10 mg total) by mouth daily.   [DISCONTINUED] DULoxetine (CYMBALTA) 60 MG capsule Take 1 capsule (60 mg total) by mouth daily.   [DISCONTINUED] HYDROcodone-acetaminophen (NORCO/VICODIN) 5-325 MG tablet Take 1-2 tablets by mouth every 4 (four) hours as needed.   [DISCONTINUED] lisinopril (PRINIVIL,ZESTRIL) 10 MG tablet TAKE ONE TABLET BY MOUTH TWICE DAILY   [DISCONTINUED] tamsulosin (FLOMAX) 0.4 MG CAPS capsule Take 1 capsule (0.4 mg total) by  mouth daily.   No facility-administered encounter medications on file as of 04/13/2021.    Follow-up: Return in about 3 weeks (around 05/04/2021).  Plan for follow-up in 2 to 3 weeks to monitor blood pressure, review labs  Ileana Chalupa J De 05/06/2021, MD

## 2021-04-14 ENCOUNTER — Encounter (HOSPITAL_BASED_OUTPATIENT_CLINIC_OR_DEPARTMENT_OTHER): Payer: Self-pay | Admitting: Family Medicine

## 2021-04-16 ENCOUNTER — Ambulatory Visit (HOSPITAL_BASED_OUTPATIENT_CLINIC_OR_DEPARTMENT_OTHER): Payer: 59 | Admitting: Family Medicine

## 2021-04-19 ENCOUNTER — Encounter (INDEPENDENT_AMBULATORY_CARE_PROVIDER_SITE_OTHER): Payer: Self-pay

## 2021-04-20 ENCOUNTER — Encounter (HOSPITAL_BASED_OUTPATIENT_CLINIC_OR_DEPARTMENT_OTHER): Payer: Self-pay | Admitting: Family Medicine

## 2021-04-22 ENCOUNTER — Ambulatory Visit: Payer: 59 | Admitting: Family

## 2021-04-27 DIAGNOSIS — H43811 Vitreous degeneration, right eye: Secondary | ICD-10-CM | POA: Diagnosis not present

## 2021-04-27 DIAGNOSIS — H3553 Other dystrophies primarily involving the sensory retina: Secondary | ICD-10-CM | POA: Diagnosis not present

## 2021-05-04 ENCOUNTER — Ambulatory Visit (HOSPITAL_BASED_OUTPATIENT_CLINIC_OR_DEPARTMENT_OTHER): Payer: 59 | Admitting: Family Medicine

## 2021-05-11 ENCOUNTER — Ambulatory Visit (HOSPITAL_BASED_OUTPATIENT_CLINIC_OR_DEPARTMENT_OTHER): Payer: 59 | Admitting: Family Medicine

## 2021-05-11 ENCOUNTER — Other Ambulatory Visit: Payer: Self-pay

## 2021-05-11 ENCOUNTER — Encounter (HOSPITAL_BASED_OUTPATIENT_CLINIC_OR_DEPARTMENT_OTHER): Payer: Self-pay | Admitting: Family Medicine

## 2021-05-11 VITALS — BP 136/92 | HR 64 | Ht 66.0 in | Wt 158.0 lb

## 2021-05-11 DIAGNOSIS — I10 Essential (primary) hypertension: Secondary | ICD-10-CM

## 2021-05-11 MED ORDER — LISINOPRIL 10 MG PO TABS: 10.0000 mg | ORAL_TABLET | Freq: Every day | ORAL | 0 refills | Status: DC

## 2021-05-11 NOTE — Progress Notes (Signed)
° ° °  Procedures performed today:    None.  Independent interpretation of notes and tests performed by another provider:   None.  Brief History, Exam, Impression, and Recommendations:    BP (!) 136/92    Pulse 64    Ht 5\' 6"  (1.676 m)    Wt 158 lb (71.7 kg)    SpO2 98%    BMI 25.50 kg/m   Essential (primary) hypertension Blood pressure borderline in office today, systolic is at goal, diastolic still slightly above goal.  Generally, blood pressure is improved since last office visit Has been checking blood pressure at home, reading somewhat similar to that in the office today Has been tolerating lisinopril well, taking 10 mg dose Denies any issues with chest pain, headaches, no lightheadedness or dizziness Discussed options, will continue with lisinopril, hold off on adding additional medications, patient will continue to work on lifestyle modifications Again discussed DASH diet, gradual increase in weekly aerobic exercise Plan for follow-up in about 6 to 8 weeks to monitor blood pressure If remaining borderline/elevated, consider addition of low-dose indapamide or calcium channel blocker Check BMP today given recent initiation of ACE inhibitor  Plan for follow-up in 6 to 8 weeks to monitor blood pressure Patient with chronic neck, shoulder pain as well as low back pain, generally has been stable, advised that if having any worsening to let us know.  Previously has been evaluated with neurology and was referred to neurosurgeon, however does not appear that he established with neurosurgeon regarding his cervical spondylosis   ___________________________________________ Mubashir Mallek de [place], MD, ABFM, CAQSM Primary Care and [place]

## 2021-05-11 NOTE — Assessment & Plan Note (Addendum)
Blood pressure borderline in office today, systolic is at goal, diastolic still slightly above goal.  Generally, blood pressure is improved since last office visit Has been checking blood pressure at home, reading somewhat similar to that in the office today Has been tolerating lisinopril well, taking 10 mg dose Denies any issues with chest pain, headaches, no lightheadedness or dizziness Discussed options, will continue with lisinopril, hold off on adding additional medications, patient will continue to work on lifestyle modifications Again discussed DASH diet, gradual increase in weekly aerobic exercise Plan for follow-up in about 6 to 8 weeks to monitor blood pressure If remaining borderline/elevated, consider addition of low-dose indapamide or calcium channel blocker Check BMP today given recent initiation of ACE inhibitor

## 2021-05-11 NOTE — Patient Instructions (Signed)
°  Medication Instructions:  °Your physician recommends that you continue on your current medications as directed. Please refer to the Current Medication list given to you today. °--If you need a refill on any your medications before your next appointment, please call your pharmacy first. If no refills are authorized on file call the office.-- ° °Follow-Up: °Your next appointment:   °Your physician recommends that you schedule a follow-up appointment in: 6-8 WEEKS with Dr. de [place] ° °You will receive a text message or e-mail with a link to a survey about your care and experience with us today! We would greatly appreciate your feedback!  ° °Thanks for letting us be apart of your health journey!!  °Primary Care and Sports Medicine  ° °Dr. Raymond de [place]  ° °We encourage you to activate your patient portal called "MyChart".  Sign up information is provided on this After Visit Summary.  MyChart is used to connect with patients for Virtual Visits (Telemedicine).  Patients are able to view lab/test results, encounter notes, upcoming appointments, etc.  Non-urgent messages can be sent to your provider as well. To learn more about what you can do with MyChart, please visit --  [email or web address]   ° °

## 2021-05-12 LAB — BASIC METABOLIC PANEL
BUN/Creatinine Ratio: 19 (ref 9–20)
BUN: 17 mg/dL (ref 6–24)
CO2: 26 mmol/L (ref 20–29)
Calcium: 10.3 mg/dL — ABNORMAL HIGH (ref 8.7–10.2)
Chloride: 100 mmol/L (ref 96–106)
Creatinine, Ser: 0.91 mg/dL (ref 0.76–1.27)
Glucose: 85 mg/dL (ref 70–99)
Potassium: 4.5 mmol/L (ref 3.5–5.2)
Sodium: 144 mmol/L (ref 134–144)
eGFR: 99 mL/min/{1.73_m2} (ref >59)

## 2021-07-06 ENCOUNTER — Ambulatory Visit (HOSPITAL_BASED_OUTPATIENT_CLINIC_OR_DEPARTMENT_OTHER): Payer: 59 | Admitting: Family Medicine

## 2021-07-12 ENCOUNTER — Other Ambulatory Visit (HOSPITAL_COMMUNITY): Payer: Self-pay

## 2021-07-12 MED ORDER — CARESTART COVID-19 HOME TEST VI KIT
PACK | 0 refills | Status: DC
  Filled 2021-07-12: qty 2, 4d supply, fill #0

## 2021-07-27 ENCOUNTER — Ambulatory Visit (HOSPITAL_BASED_OUTPATIENT_CLINIC_OR_DEPARTMENT_OTHER): Payer: 59 | Admitting: Family Medicine

## 2021-08-03 ENCOUNTER — Ambulatory Visit (INDEPENDENT_AMBULATORY_CARE_PROVIDER_SITE_OTHER): Payer: Commercial Managed Care - PPO | Admitting: Family Medicine

## 2021-08-03 VITALS — BP 124/82 | HR 71 | Ht 66.0 in | Wt 158.7 lb

## 2021-08-03 DIAGNOSIS — I10 Essential (primary) hypertension: Secondary | ICD-10-CM

## 2021-08-03 DIAGNOSIS — Z0000 Encounter for general adult medical examination without abnormal findings: Secondary | ICD-10-CM

## 2021-08-03 NOTE — Patient Instructions (Signed)
?  Medication Instructions:  ?Your physician recommends that you continue on your current medications as directed. Please refer to the Current Medication list given to you today. ? ? ? ?Follow-Up: ?Your next appointment:   ?Your physician recommends that you schedule a follow-up appointment in: 3 month CPE and 1 week before for labs with Dr. Tommi Rumps [place] ? ?You will receive a text message or e-mail with a link to a survey about your care and experience with [place] today! We would greatly appreciate your feedback!  ? ?Thanks for letting us be apart of your health journey!!  ?Primary Care and Sports Medicine  ? ?Dr. Marcy Salvo de [place]  ? ?We encourage you to activate your patient portal called "MyChart".  Sign up information is provided on this After Visit Summary.  MyChart is used to connect with patients for Virtual Visits (Telemedicine).  Patients are able to view lab/test results, encounter notes, upcoming appointments, etc.  Non-urgent messages can be sent to your provider as well. To learn more about what you can do with MyChart, please visit --  [email or web address].   ? ?

## 2021-08-03 NOTE — Progress Notes (Signed)
? ? ?  Procedures performed today:   ? ?None. ? ?Independent interpretation of notes and tests performed by another provider:  ? ?None. ? ?Brief History, Exam, Impression, and Recommendations:   ? ?BP 124/82 (BP Location: Right Arm, Patient Position: Sitting, Cuff Size: Normal)   Pulse 71   Ht 5\' 6"  (1.676 m)   Wt 158 lb 11.2 oz (72 kg)   SpO2 98%   BMI 25.61 kg/m?  ? ?Essential (primary) hypertension ?Patient reports that he has been doing well, denies any issues with chest pain or headaches.  Continues with lisinopril 10 mg daily.  Has also been checking blood pressure at home, it will be variable at times ?Blood pressure today is at goal, borderline diastolic readings ?Recommend continue with lisinopril 10 mg daily, lifestyle modifications, DASH diet ?We will plan for follow-up in about 3 months for CPE, fasting blood work a few days prior ? ?Patient does mention some dry skin over his elbows, has been using OTC moisturizer.  Discussed use of lotions or moisturizers without any fragrances or dyes as these can lead to some rebound dry skin.  If continuing to have issues or not responding to above, recommend returning to clinic for further evaluation ? ? ?___________________________________________ ?Zenith Lamphier de , MD, ABFM, CAQSM ?Primary Care and Sports Medicine ?[facility] ?

## 2021-08-03 NOTE — Assessment & Plan Note (Signed)
Patient reports that he has been doing well, denies any issues with chest pain or headaches.  Continues with lisinopril 10 mg daily.  Has also been checking blood pressure at home, it will be variable at times ?Blood pressure today is at goal, borderline diastolic readings ?Recommend continue with lisinopril 10 mg daily, lifestyle modifications, DASH diet ?We will plan for follow-up in about 3 months for CPE, fasting blood work a few days prior ?

## 2021-09-07 ENCOUNTER — Other Ambulatory Visit (HOSPITAL_BASED_OUTPATIENT_CLINIC_OR_DEPARTMENT_OTHER): Payer: Self-pay | Admitting: Family Medicine

## 2021-09-07 DIAGNOSIS — I10 Essential (primary) hypertension: Secondary | ICD-10-CM

## 2021-09-07 MED ORDER — LISINOPRIL 10 MG PO TABS: 10.0000 mg | ORAL_TABLET | Freq: Every day | ORAL | 0 refills | Status: DC

## 2021-10-26 ENCOUNTER — Ambulatory Visit (HOSPITAL_BASED_OUTPATIENT_CLINIC_OR_DEPARTMENT_OTHER): Payer: 59

## 2021-10-26 DIAGNOSIS — Z0000 Encounter for general adult medical examination without abnormal findings: Secondary | ICD-10-CM

## 2021-10-27 LAB — LIPID PANEL
Chol/HDL Ratio: 5 ratio (ref 0.0–5.0)
Cholesterol, Total: 245 mg/dL — ABNORMAL HIGH (ref 100–199)
HDL: 49 mg/dL (ref >39)
LDL Chol Calc (NIH): 179 mg/dL — ABNORMAL HIGH (ref 0–99)
Triglycerides: 99 mg/dL (ref 0–149)
VLDL Cholesterol Cal: 17 mg/dL (ref 5–40)

## 2021-10-27 LAB — CBC WITH DIFFERENTIAL/PLATELET
Basophils Absolute: 0 10*3/uL (ref 0.0–0.2)
Basos: 1 %
EOS (ABSOLUTE): 0.1 10*3/uL (ref 0.0–0.4)
Eos: 2 %
Hematocrit: 47.7 % (ref 37.5–51.0)
Hemoglobin: 16.2 g/dL (ref 13.0–17.7)
Immature Grans (Abs): 0 10*3/uL (ref 0.0–0.1)
Immature Granulocytes: 0 %
Lymphocytes Absolute: 1.8 10*3/uL (ref 0.7–3.1)
Lymphs: 34 %
MCH: 31.6 pg (ref 26.6–33.0)
MCHC: 34 g/dL (ref 31.5–35.7)
MCV: 93 fL (ref 79–97)
Monocytes Absolute: 0.4 10*3/uL (ref 0.1–0.9)
Monocytes: 8 %
Neutrophils Absolute: 2.9 10*3/uL (ref 1.4–7.0)
Neutrophils: 55 %
Platelets: 252 10*3/uL (ref 150–450)
RBC: 5.12 x10E6/uL (ref 4.14–5.80)
RDW: 11.9 % (ref 11.6–15.4)
WBC: 5.2 10*3/uL (ref 3.4–10.8)

## 2021-10-27 LAB — HEMOGLOBIN A1C
Est. average glucose Bld gHb Est-mCnc: 123 mg/dL
Hgb A1c MFr Bld: 5.9 % — ABNORMAL HIGH (ref 4.8–5.6)

## 2021-10-27 LAB — COMPREHENSIVE METABOLIC PANEL
ALT: 24 IU/L (ref 0–44)
AST: 21 IU/L (ref 0–40)
Albumin/Globulin Ratio: 1.8 (ref 1.2–2.2)
Albumin: 4.6 g/dL (ref 3.8–4.9)
Alkaline Phosphatase: 79 IU/L (ref 44–121)
BUN/Creatinine Ratio: 11 (ref 9–20)
BUN: 11 mg/dL (ref 6–24)
Bilirubin Total: 0.8 mg/dL (ref 0.0–1.2)
CO2: 26 mmol/L (ref 20–29)
Calcium: 10 mg/dL (ref 8.7–10.2)
Chloride: 100 mmol/L (ref 96–106)
Creatinine, Ser: 0.97 mg/dL (ref 0.76–1.27)
Globulin, Total: 2.6 g/dL (ref 1.5–4.5)
Glucose: 88 mg/dL (ref 70–99)
Potassium: 4.8 mmol/L (ref 3.5–5.2)
Sodium: 138 mmol/L (ref 134–144)
Total Protein: 7.2 g/dL (ref 6.0–8.5)
eGFR: 92 mL/min/{1.73_m2} (ref >59)

## 2021-10-27 LAB — TSH RFX ON ABNORMAL TO FREE T4: TSH: 1.14 u[IU]/mL (ref 0.450–4.500)

## 2021-11-02 ENCOUNTER — Ambulatory Visit (INDEPENDENT_AMBULATORY_CARE_PROVIDER_SITE_OTHER): Payer: 59 | Admitting: Family Medicine

## 2021-11-02 ENCOUNTER — Encounter (HOSPITAL_BASED_OUTPATIENT_CLINIC_OR_DEPARTMENT_OTHER): Payer: Self-pay | Admitting: Family Medicine

## 2021-11-02 VITALS — BP 149/93 | HR 65 | Temp 97.6°F | Ht 66.0 in | Wt 158.6 lb

## 2021-11-02 DIAGNOSIS — Z23 Encounter for immunization: Secondary | ICD-10-CM | POA: Diagnosis not present

## 2021-11-02 DIAGNOSIS — Z0000 Encounter for general adult medical examination without abnormal findings: Secondary | ICD-10-CM | POA: Diagnosis not present

## 2021-11-02 MED ORDER — SHINGRIX 50 MCG/0.5ML IM SUSR: 0.5000 mL | Freq: Once | INTRAMUSCULAR | 0 refills | Status: AC

## 2021-11-02 NOTE — Assessment & Plan Note (Addendum)
Routine HCM labs reviewed. HCM reviewed/discussed. Anticipatory guidance regarding healthy weight, lifestyle and choices given. Recommend healthy diet.  Recommend approximately 150 minutes/week of moderate intensity exercise Recommend regular dental and vision exams Always use seatbelt/lap and shoulder restraints Recommend using smoke alarms and checking batteries at least twice a year Recommend using sunscreen when outside Discussed colon cancer screening recommendations, options.  Patient is UTD Discussed recommendations for shingles vaccine.  Patient is amenable, Rx sent to pharmacy Discussed tetanus immunization recommendations, patient interested, complete at next office visit

## 2021-11-02 NOTE — Progress Notes (Signed)
Subjective:    CC: Annual Physical Exam  HPI:  Ryan Rosario is a 56 y.o. presenting for annual physical  I reviewed the past medical history, family history, social history, surgical history, and allergies today and no changes were needed.  Please see the problem list section below in epic for further details.  Past Medical History: Past Medical History:  Diagnosis Date   Hyperlipidemia 04/13/2021   Hypertension    Leg cramps 04/11/2014   hx   Neck pain    hx - pinch nerve   Past Surgical History: Past Surgical History:  Procedure Laterality Date   EYE SURGERY Left    [facility] - 1974   Social History: Social History   Socioeconomic History   Marital status: Divorced    Spouse name: Not on file   Number of children: 1   Years of education: BS degree   Highest education level: Not on file  Occupational History   Occupation: Physiological scientist  Tobacco Use   Smoking status: Never   Smokeless tobacco: Never  Vaping Use   Vaping Use: Never used  Substance and Sexual Activity   Alcohol use: No    Comment: No use since 1983   Drug use: No   Sexual activity: Not Currently  Other Topics Concern   Not on file  Social History Narrative   Right-handed.   1-2 cups caffeine per day.   Social Determinants of Health   Financial Resource Strain: Not on file  Food Insecurity: Not on file  Transportation Needs: Not on file  Physical Activity: Not on file  Stress: Not on file  Social Connections: Not on file   Family History: Family History  Problem Relation Age of Onset   Hypertension Mother    Aneurysm Mother    Kidney disease Father    Hypertension Father    Diabetes Father    Congestive Heart Failure Father    Cancer Sister    Cancer Maternal Grandmother        lung   Diabetes Maternal Grandfather    Colon cancer Neg Hx    Colon polyps Neg Hx    Esophageal cancer Neg Hx    Rectal cancer Neg Hx    Stomach cancer Neg Hx    Allergies: No Known  Allergies Medications: See med rec.  Review of Systems: No headache, visual changes, nausea, vomiting, diarrhea, constipation, dizziness, abdominal pain, skin rash, fevers, chills, night sweats, swollen lymph nodes, weight loss, chest pain, body aches, joint swelling, muscle aches, shortness of breath, mood changes, visual or auditory hallucinations.  Objective:    BP (!) 149/93   Pulse 65   Temp 97.6 F (36.4 C) (Oral)   Ht 5\' 6"  (1.676 m)   Wt 158 lb 9.6 oz (71.9 kg)   SpO2 100%   BMI 25.60 kg/m   General: Well Developed, well nourished, and in no acute distress. Neuro: Alert and oriented x3, extra-ocular muscles intact, sensation grossly intact. Cranial nerves II through XII are intact, motor, sensory, and coordinative functions are all intact. HEENT: Normocephalic, atraumatic, pupils equal round reactive to light, neck supple, no masses, no lymphadenopathy, thyroid nonpalpable. Oropharynx, nasopharynx, external ear canals are unremarkable. Skin: Warm and dry, no rashes noted. Cardiac: Regular rate and rhythm, no murmurs rubs or gallops. Respiratory: Clear to auscultation bilaterally. Not using accessory muscles, speaking in full sentences. Abdominal: Soft, nontender, nondistended, positive bowel sounds, no masses, no organomegaly. Musculoskeletal: Shoulder, elbow, wrist, hip, knee, ankle stable, and with  full range of motion.  Impression and Recommendations:    Wellness examination Routine HCM labs reviewed. HCM reviewed/discussed. Anticipatory guidance regarding healthy weight, lifestyle and choices given. Recommend healthy diet.  Recommend approximately 150 minutes/week of moderate intensity exercise Recommend regular dental and vision exams Always use seatbelt/lap and shoulder restraints Recommend using smoke alarms and checking batteries at least twice a year Recommend using sunscreen when outside Discussed colon cancer screening recommendations, options.  Patient is  UTD Discussed recommendations for shingles vaccine.  Patient is amenable, Rx sent to pharmacy Discussed tetanus immunization recommendations, patient interested, complete at next office visit  Patient has been having some irregular bowel movements, discussed general measures to consider, will plan for close follow-up to reassess and determine if any further evaluation is needed  Return in about 4 weeks (around 11/30/2021) for bowel movements.   ___________________________________________ Eddy Liszewski de [place], MD, ABFM, CAQSM Primary Care and Sports Medicine [facility]

## 2021-11-02 NOTE — Patient Instructions (Signed)

## 2021-11-30 ENCOUNTER — Ambulatory Visit (HOSPITAL_BASED_OUTPATIENT_CLINIC_OR_DEPARTMENT_OTHER): Payer: 59 | Admitting: Family Medicine

## 2021-12-06 ENCOUNTER — Other Ambulatory Visit (HOSPITAL_BASED_OUTPATIENT_CLINIC_OR_DEPARTMENT_OTHER): Payer: Self-pay | Admitting: Family Medicine

## 2021-12-06 DIAGNOSIS — I10 Essential (primary) hypertension: Secondary | ICD-10-CM

## 2021-12-06 MED ORDER — LISINOPRIL 10 MG PO TABS: 10.0000 mg | ORAL_TABLET | Freq: Every day | ORAL | 0 refills | Status: DC

## 2021-12-14 ENCOUNTER — Encounter (HOSPITAL_BASED_OUTPATIENT_CLINIC_OR_DEPARTMENT_OTHER): Payer: Self-pay | Admitting: Family Medicine

## 2021-12-14 ENCOUNTER — Ambulatory Visit (INDEPENDENT_AMBULATORY_CARE_PROVIDER_SITE_OTHER): Payer: 59 | Admitting: Family Medicine

## 2021-12-14 VITALS — BP 166/115 | HR 63 | Temp 97.8°F | Ht 66.0 in | Wt 159.1 lb

## 2021-12-14 DIAGNOSIS — I10 Essential (primary) hypertension: Secondary | ICD-10-CM

## 2021-12-14 DIAGNOSIS — R198 Other specified symptoms and signs involving the digestive system and abdomen: Secondary | ICD-10-CM | POA: Diagnosis not present

## 2021-12-14 NOTE — Assessment & Plan Note (Signed)
Blood pressure is elevated in the office today.  He reports that he was rushing to his appointment today and that there is also been some increased stress related to things at home.  He does not have any current chest pain or headaches.  Has not been checking his blood pressure at home.  He does continue with lisinopril 10 mg daily Will need to keep close eye on blood pressure reading forward.  Discussed possibility that he may need an additional agent to better control blood pressure Recommend intermittent monitoring at home, DASH diet Plan for close follow-up in about 6 to 8 weeks to monitor blood pressure, consider addition of another antihypertensive such as amlodipine at low-dose

## 2021-12-14 NOTE — Patient Instructions (Signed)
  Medication Instructions:  Your physician recommends that you continue on your current medications as directed. Please refer to the Current Medication list given to you today. --If you need a refill on any your medications before your next appointment, please call your pharmacy first. If no refills are authorized on file call the office.-- Lab Work: Your physician has recommended that you have lab work today: No If you have labs (blood work) drawn today and your tests are completely normal, you will receive your results via MyChart message OR a phone call from our staff.  Please ensure you check your voicemail in the event that you authorized detailed messages to be left on a delegated number. If you have any lab test that is abnormal or we need to change your treatment, we will call you to review the results.  Referrals/Procedures/Imaging: No  Follow-Up: Your next appointment:   Your physician recommends that you schedule a follow-up appointment in: 6-8 weeks follow-up with Dr. de [place].  You will receive a text message or e-mail with a link to a survey about your care and experience with us today! We would greatly appreciate your feedback!   Thanks for letting us be apart of your health journey!!  Primary Care and Sports Medicine   Dr. Raymond de [place]   We encourage you to activate your patient portal called "MyChart".  Sign up information is provided on this After Visit Summary.  MyChart is used to connect with patients for Virtual Visits (Telemedicine).  Patients are able to view lab/test results, encounter notes, upcoming appointments, etc.  Non-urgent messages can be sent to your provider as well. To learn more about what you can do with MyChart, please visit --  [email or web address]    

## 2021-12-14 NOTE — Assessment & Plan Note (Signed)
Patient reports that since last office visit, bowel movements have become more regular.  Previously he was having more issues with intermittent diarrhea.  Reports that he did ingest dairy intake as well as decreasing coffee intake.  He has not had any abdominal pain.  No blood in the stool. Today, vital signs are stable, abdomen with normal bowel sounds, nontender, nondistended. At this time, can proceed with gradual return to normal diet and monitoring symptoms.  If he does have recurrence of symptoms or continued intermittent issues with loose stool or diarrhea, we can look to arrange for evaluation with GI

## 2021-12-14 NOTE — Progress Notes (Signed)
    Procedures performed today:    None.  Independent interpretation of notes and tests performed by another provider:   None.  Brief History, Exam, Impression, and Recommendations:    BP (!) 166/115   Pulse 63   Temp 97.8 F (36.6 C) (Oral)   Ht 5\' 6"  (1.676 m)   Wt 159 lb 1.6 oz (72.2 kg)   SpO2 100%   BMI 25.68 kg/m   Abnormal bowel movement Patient reports that since last office visit, bowel movements have become more regular.  Previously he was having more issues with intermittent diarrhea.  Reports that he did ingest dairy intake as well as decreasing coffee intake.  He has not had any abdominal pain.  No blood in the stool. Today, vital signs are stable, abdomen with normal bowel sounds, nontender, nondistended. At this time, can proceed with gradual return to normal diet and monitoring symptoms.  If he does have recurrence of symptoms or continued intermittent issues with loose stool or diarrhea, we can look to arrange for evaluation with GI  Essential (primary) hypertension Blood pressure is elevated in the office today.  He reports that he was rushing to his appointment today and that there is also been some increased stress related to things at home.  He does not have any current chest pain or headaches.  Has not been checking his blood pressure at home.  He does continue with lisinopril 10 mg daily Will need to keep close eye on blood pressure reading forward.  Discussed possibility that he may need an additional agent to better control blood pressure Recommend intermittent monitoring at home, DASH diet Plan for close follow-up in about 6 to 8 weeks to monitor blood pressure, consider addition of another antihypertensive such as amlodipine at low-dose  Return in about 6 weeks (around 01/25/2022) for HTN.   ___________________________________________ Madilynn Montante de 01/27/2022, MD, ABFM, CAQSM Primary Care and Sports Medicine [facility]

## 2022-01-26 ENCOUNTER — Ambulatory Visit (HOSPITAL_BASED_OUTPATIENT_CLINIC_OR_DEPARTMENT_OTHER): Payer: 59 | Admitting: Family Medicine

## 2022-02-01 ENCOUNTER — Encounter (HOSPITAL_BASED_OUTPATIENT_CLINIC_OR_DEPARTMENT_OTHER): Payer: Self-pay | Admitting: Family Medicine

## 2022-02-01 ENCOUNTER — Other Ambulatory Visit (HOSPITAL_BASED_OUTPATIENT_CLINIC_OR_DEPARTMENT_OTHER): Payer: Self-pay

## 2022-02-01 ENCOUNTER — Ambulatory Visit (HOSPITAL_BASED_OUTPATIENT_CLINIC_OR_DEPARTMENT_OTHER): Payer: 59 | Admitting: Family Medicine

## 2022-02-01 VITALS — BP 156/92 | HR 61 | Temp 97.6°F | Ht 66.0 in | Wt 160.0 lb

## 2022-02-01 DIAGNOSIS — R197 Diarrhea, unspecified: Secondary | ICD-10-CM

## 2022-02-01 DIAGNOSIS — I10 Essential (primary) hypertension: Secondary | ICD-10-CM

## 2022-02-01 MED ORDER — AMLODIPINE BESYLATE 5 MG PO TABS
5.0000 mg | ORAL_TABLET | Freq: Every day | ORAL | 1 refills | Status: DC
  Filled 2022-02-01 – 2022-03-02 (×2): qty 30, 30d supply, fill #0

## 2022-02-01 NOTE — Patient Instructions (Signed)
  Medication Instructions:  Your physician recommends that you continue on your current medications as directed. Please refer to the Current Medication list given to you today. --If you need a refill on any your medications before your next appointment, please call your pharmacy first. If no refills are authorized on file call the office.-- Lab Work: Your physician has recommended that you have lab work today: Yes If you have labs (blood work) drawn today and your tests are completely normal, you will receive your results via MyChart message OR a phone call from our staff.  Please ensure you check your voicemail in the event that you authorized detailed messages to be left on a delegated number. If you have any lab test that is abnormal or we need to change your treatment, we will call you to review the results.  Referrals/Procedures/Imaging: No  Follow-Up: Your next appointment:   Your physician recommends that you schedule a follow-up appointment in: 6 weeks with Dr. de [place].  You will receive a text message or e-mail with a link to a survey about your care and experience with us today! We would greatly appreciate your feedback!   Thanks for letting us be apart of your health journey!!  Primary Care and Sports Medicine   Dr. Raymond de [place]   We encourage you to activate your patient portal called "MyChart".  Sign up information is provided on this After Visit Summary.  MyChart is used to connect with patients for Virtual Visits (Telemedicine).  Patients are able to view lab/test results, encounter notes, upcoming appointments, etc.  Non-urgent messages can be sent to your provider as well. To learn more about what you can do with MyChart, please visit --  [email or web address]    

## 2022-02-01 NOTE — Assessment & Plan Note (Signed)
Previously, patient was having irregular bowel movements, he did note some improvement, however reports that he is still having some issues with intermittent loose stool, diarrhea.  He has not had any noticeable abdominal pain.  Does not note any association with dietary changes.  He has not had any fever, chills, sweats, weight loss. Given continued symptoms, we will proceed with laboratory evaluation.  We will check celiac serologies today, ESR.  We will also complete stool studies to further assess for potential underlying causes Consider evaluation with GI pending results of above testing

## 2022-02-01 NOTE — Progress Notes (Signed)
    Procedures performed today:    None.  Independent interpretation of notes and tests performed by another provider:   None.  Brief History, Exam, Impression, and Recommendations:    BP (!) 156/92   Pulse 61   Temp 97.6 F (36.4 C) (Oral)   Ht $R'5\' 6"'zP$  (1.676 m)   Wt 160 lb (72.6 kg)   SpO2 99%   BMI 25.82 kg/m   Essential (primary) hypertension Blood pressure in office today is improved compared to last visit, however still above goal.  He has not been checking his blood pressure cuff at home unfortunately.  He does continue with lisinopril 10 mg daily.  Denies any current issues with chest pain or headaches, no visual changes. Discussed options with patient today, given continued elevation in blood pressure, would recommend adjustment to current treatment.  Discussed options of increasing dose of lisinopril versus adding second agent.  Did discuss that addition of second agent is more likely to be helpful in regards to blood pressure control.  He is amenable to adding a second agent, we will add amlodipine 5 mg daily, continue with lisinopril 10 mg daily Recommend intermittent monitoring of blood pressure at home, DASH diet.  Plan for close follow-up in about 4 to 6 weeks to assess progress  Diarrhea Previously, patient was having irregular bowel movements, he did note some improvement, however reports that he is still having some issues with intermittent loose stool, diarrhea.  He has not had any noticeable abdominal pain.  Does not note any association with dietary changes.  He has not had any fever, chills, sweats, weight loss. Given continued symptoms, we will proceed with laboratory evaluation.  We will check celiac serologies today, ESR.  We will also complete stool studies to further assess for potential underlying causes Consider evaluation with GI pending results of above testing  Return in about 6 weeks (around 03/15/2022) for  HTN.   ___________________________________________ Rhylee Pucillo de [place], MD, ABFM, [facility] and [place]

## 2022-02-01 NOTE — Assessment & Plan Note (Signed)
Blood pressure in office today is improved compared to last visit, however still above goal.  He has not been checking his blood pressure cuff at home unfortunately.  He does continue with lisinopril 10 mg daily.  Denies any current issues with chest pain or headaches, no visual changes. Discussed options with patient today, given continued elevation in blood pressure, would recommend adjustment to current treatment.  Discussed options of increasing dose of lisinopril versus adding second agent.  Did discuss that addition of second agent is more likely to be helpful in regards to blood pressure control.  He is amenable to adding a second agent, we will add amlodipine 5 mg daily, continue with lisinopril 10 mg daily Recommend intermittent monitoring of blood pressure at home, DASH diet.  Plan for close follow-up in about 4 to 6 weeks to assess progress

## 2022-02-02 ENCOUNTER — Other Ambulatory Visit (HOSPITAL_BASED_OUTPATIENT_CLINIC_OR_DEPARTMENT_OTHER): Payer: Self-pay

## 2022-02-02 LAB — GLIA (IGA/G) + TTG IGA
Antigliadin Abs, IgA: 7 units (ref 0–19)
Gliadin IgG: 8 units (ref 0–19)
Transglutaminase IgA: <2 U/mL (ref 0–3)

## 2022-02-02 LAB — SEDIMENTATION RATE: Sed Rate: 18 mm/hr (ref 0–30)

## 2022-02-03 ENCOUNTER — Other Ambulatory Visit (HOSPITAL_BASED_OUTPATIENT_CLINIC_OR_DEPARTMENT_OTHER): Payer: Self-pay

## 2022-02-03 ENCOUNTER — Other Ambulatory Visit (HOSPITAL_BASED_OUTPATIENT_CLINIC_OR_DEPARTMENT_OTHER): Payer: Self-pay | Admitting: Family Medicine

## 2022-02-03 DIAGNOSIS — R197 Diarrhea, unspecified: Secondary | ICD-10-CM

## 2022-02-03 NOTE — Addendum Note (Signed)
Addended by: Lowella Bandy on: 02/03/2022 09:38 AM   Modules accepted: Orders

## 2022-02-07 LAB — GIARDIA, EIA; OVA/PARASITE: Giardia Ag, Stl: NEGATIVE

## 2022-02-07 LAB — CALPROTECTIN, FECAL: Calprotectin, Fecal: 12 ug/g (ref 0–120)

## 2022-03-02 ENCOUNTER — Other Ambulatory Visit (HOSPITAL_COMMUNITY): Payer: Self-pay

## 2022-03-07 ENCOUNTER — Other Ambulatory Visit (HOSPITAL_COMMUNITY): Payer: Self-pay

## 2022-03-07 ENCOUNTER — Other Ambulatory Visit (HOSPITAL_BASED_OUTPATIENT_CLINIC_OR_DEPARTMENT_OTHER): Payer: Self-pay

## 2022-03-07 DIAGNOSIS — I10 Essential (primary) hypertension: Secondary | ICD-10-CM

## 2022-03-07 MED ORDER — LISINOPRIL 10 MG PO TABS
10.0000 mg | ORAL_TABLET | Freq: Every day | ORAL | 0 refills | Status: DC
  Filled 2022-03-07: qty 90, 90d supply, fill #0

## 2022-03-09 ENCOUNTER — Encounter (HOSPITAL_BASED_OUTPATIENT_CLINIC_OR_DEPARTMENT_OTHER): Payer: Self-pay

## 2022-03-28 ENCOUNTER — Other Ambulatory Visit (HOSPITAL_BASED_OUTPATIENT_CLINIC_OR_DEPARTMENT_OTHER): Payer: Self-pay | Admitting: Family Medicine

## 2022-03-28 ENCOUNTER — Other Ambulatory Visit (HOSPITAL_COMMUNITY): Payer: Self-pay

## 2022-03-28 DIAGNOSIS — I10 Essential (primary) hypertension: Secondary | ICD-10-CM

## 2022-03-28 MED ORDER — AMLODIPINE BESYLATE 5 MG PO TABS
5.0000 mg | ORAL_TABLET | Freq: Every day | ORAL | 1 refills | Status: DC
  Filled 2022-03-28: qty 30, 30d supply, fill #0
  Filled 2022-05-02: qty 30, 30d supply, fill #1

## 2022-03-28 MED ORDER — LISINOPRIL 10 MG PO TABS
10.0000 mg | ORAL_TABLET | Freq: Every day | ORAL | 0 refills | Status: DC
  Filled 2022-03-28 – 2022-06-02 (×3): qty 90, 90d supply, fill #0

## 2022-03-29 ENCOUNTER — Other Ambulatory Visit (HOSPITAL_COMMUNITY): Payer: Self-pay

## 2022-05-03 ENCOUNTER — Other Ambulatory Visit (HOSPITAL_COMMUNITY): Payer: Self-pay

## 2022-05-23 ENCOUNTER — Encounter (HOSPITAL_BASED_OUTPATIENT_CLINIC_OR_DEPARTMENT_OTHER): Payer: Self-pay

## 2022-06-01 ENCOUNTER — Other Ambulatory Visit (HOSPITAL_BASED_OUTPATIENT_CLINIC_OR_DEPARTMENT_OTHER): Payer: Self-pay | Admitting: Family Medicine

## 2022-06-01 ENCOUNTER — Other Ambulatory Visit (HOSPITAL_COMMUNITY): Payer: Self-pay

## 2022-06-01 DIAGNOSIS — I10 Essential (primary) hypertension: Secondary | ICD-10-CM

## 2022-06-02 ENCOUNTER — Other Ambulatory Visit: Payer: Self-pay

## 2022-06-02 ENCOUNTER — Other Ambulatory Visit (HOSPITAL_COMMUNITY): Payer: Self-pay

## 2022-06-02 MED ORDER — AMLODIPINE BESYLATE 5 MG PO TABS
5.0000 mg | ORAL_TABLET | Freq: Every day | ORAL | 1 refills | Status: DC
  Filled 2022-06-02: qty 30, 30d supply, fill #0
  Filled 2022-06-30: qty 30, 30d supply, fill #1

## 2022-06-30 ENCOUNTER — Other Ambulatory Visit (HOSPITAL_COMMUNITY): Payer: Self-pay

## 2022-06-30 ENCOUNTER — Other Ambulatory Visit (HOSPITAL_BASED_OUTPATIENT_CLINIC_OR_DEPARTMENT_OTHER): Payer: Self-pay | Admitting: Family Medicine

## 2022-06-30 ENCOUNTER — Other Ambulatory Visit: Payer: Self-pay

## 2022-06-30 DIAGNOSIS — I10 Essential (primary) hypertension: Secondary | ICD-10-CM

## 2022-06-30 MED ORDER — LISINOPRIL 10 MG PO TABS
10.0000 mg | ORAL_TABLET | Freq: Every day | ORAL | 0 refills | Status: DC
  Filled 2022-06-30 – 2022-08-29 (×2): qty 90, 90d supply, fill #0

## 2022-07-01 ENCOUNTER — Other Ambulatory Visit: Payer: Self-pay

## 2022-08-01 ENCOUNTER — Other Ambulatory Visit (HOSPITAL_COMMUNITY): Payer: Self-pay

## 2022-08-01 ENCOUNTER — Other Ambulatory Visit (HOSPITAL_BASED_OUTPATIENT_CLINIC_OR_DEPARTMENT_OTHER): Payer: Self-pay | Admitting: Family Medicine

## 2022-08-01 DIAGNOSIS — I10 Essential (primary) hypertension: Secondary | ICD-10-CM

## 2022-08-01 MED ORDER — AMLODIPINE BESYLATE 5 MG PO TABS
5.0000 mg | ORAL_TABLET | Freq: Every day | ORAL | 1 refills | Status: DC
  Filled 2022-08-01: qty 30, 30d supply, fill #0
  Filled 2022-08-29: qty 30, 30d supply, fill #1

## 2022-08-29 ENCOUNTER — Other Ambulatory Visit (HOSPITAL_COMMUNITY): Payer: Self-pay

## 2022-08-29 ENCOUNTER — Other Ambulatory Visit: Payer: Self-pay

## 2022-10-05 ENCOUNTER — Other Ambulatory Visit (HOSPITAL_COMMUNITY): Payer: Self-pay

## 2022-10-05 ENCOUNTER — Other Ambulatory Visit (HOSPITAL_BASED_OUTPATIENT_CLINIC_OR_DEPARTMENT_OTHER): Payer: Self-pay | Admitting: Family Medicine

## 2022-10-05 DIAGNOSIS — I10 Essential (primary) hypertension: Secondary | ICD-10-CM

## 2022-10-05 MED ORDER — AMLODIPINE BESYLATE 5 MG PO TABS
5.0000 mg | ORAL_TABLET | Freq: Every day | ORAL | 1 refills | Status: DC
  Filled 2022-10-05: qty 30, 30d supply, fill #0
  Filled 2022-10-31: qty 30, 30d supply, fill #1

## 2022-12-07 ENCOUNTER — Other Ambulatory Visit (HOSPITAL_COMMUNITY): Payer: Self-pay

## 2022-12-07 ENCOUNTER — Other Ambulatory Visit (HOSPITAL_BASED_OUTPATIENT_CLINIC_OR_DEPARTMENT_OTHER): Payer: Self-pay

## 2022-12-07 ENCOUNTER — Other Ambulatory Visit (HOSPITAL_BASED_OUTPATIENT_CLINIC_OR_DEPARTMENT_OTHER): Payer: Self-pay | Admitting: Family Medicine

## 2022-12-07 DIAGNOSIS — I10 Essential (primary) hypertension: Secondary | ICD-10-CM

## 2022-12-07 MED ORDER — LISINOPRIL 10 MG PO TABS
10.0000 mg | ORAL_TABLET | Freq: Every day | ORAL | 0 refills | Status: DC
  Filled 2022-12-07: qty 90, 90d supply, fill #0

## 2022-12-07 MED ORDER — AMLODIPINE BESYLATE 5 MG PO TABS
5.0000 mg | ORAL_TABLET | Freq: Every day | ORAL | 1 refills | Status: DC
  Filled 2022-12-07: qty 30, 30d supply, fill #0
  Filled 2023-01-03: qty 30, 30d supply, fill #1

## 2022-12-21 ENCOUNTER — Encounter (HOSPITAL_BASED_OUTPATIENT_CLINIC_OR_DEPARTMENT_OTHER): Payer: Self-pay | Admitting: Family Medicine

## 2023-02-02 ENCOUNTER — Other Ambulatory Visit (HOSPITAL_BASED_OUTPATIENT_CLINIC_OR_DEPARTMENT_OTHER): Payer: Self-pay | Admitting: Family Medicine

## 2023-02-02 ENCOUNTER — Other Ambulatory Visit (HOSPITAL_COMMUNITY): Payer: Self-pay

## 2023-02-02 DIAGNOSIS — I10 Essential (primary) hypertension: Secondary | ICD-10-CM

## 2023-02-02 MED ORDER — AMLODIPINE BESYLATE 5 MG PO TABS
5.0000 mg | ORAL_TABLET | Freq: Every day | ORAL | 0 refills | Status: DC
  Filled 2023-02-10: qty 15, 15d supply, fill #0

## 2023-02-10 ENCOUNTER — Other Ambulatory Visit (HOSPITAL_COMMUNITY): Payer: Self-pay

## 2023-02-19 ENCOUNTER — Other Ambulatory Visit (HOSPITAL_BASED_OUTPATIENT_CLINIC_OR_DEPARTMENT_OTHER): Payer: Self-pay | Admitting: Family Medicine

## 2023-02-19 DIAGNOSIS — I10 Essential (primary) hypertension: Secondary | ICD-10-CM

## 2023-02-20 ENCOUNTER — Other Ambulatory Visit (HOSPITAL_COMMUNITY): Payer: Self-pay

## 2023-02-20 MED ORDER — AMLODIPINE BESYLATE 5 MG PO TABS
5.0000 mg | ORAL_TABLET | Freq: Every day | ORAL | 0 refills | Status: DC
  Filled 2023-02-20 – 2023-02-24 (×2): qty 15, 15d supply, fill #0

## 2023-02-24 ENCOUNTER — Other Ambulatory Visit (HOSPITAL_COMMUNITY): Payer: Self-pay

## 2023-02-25 ENCOUNTER — Other Ambulatory Visit (HOSPITAL_COMMUNITY): Payer: Self-pay

## 2023-02-27 ENCOUNTER — Other Ambulatory Visit (HOSPITAL_COMMUNITY): Payer: Self-pay

## 2023-03-07 ENCOUNTER — Other Ambulatory Visit (HOSPITAL_BASED_OUTPATIENT_CLINIC_OR_DEPARTMENT_OTHER): Payer: Self-pay | Admitting: Family Medicine

## 2023-03-07 DIAGNOSIS — I10 Essential (primary) hypertension: Secondary | ICD-10-CM

## 2023-03-15 ENCOUNTER — Other Ambulatory Visit (HOSPITAL_BASED_OUTPATIENT_CLINIC_OR_DEPARTMENT_OTHER): Payer: Self-pay | Admitting: Family Medicine

## 2023-03-15 DIAGNOSIS — I10 Essential (primary) hypertension: Secondary | ICD-10-CM

## 2023-03-17 ENCOUNTER — Other Ambulatory Visit (HOSPITAL_BASED_OUTPATIENT_CLINIC_OR_DEPARTMENT_OTHER): Payer: Self-pay | Admitting: Family Medicine

## 2023-03-17 DIAGNOSIS — I10 Essential (primary) hypertension: Secondary | ICD-10-CM

## 2023-03-20 ENCOUNTER — Other Ambulatory Visit (HOSPITAL_COMMUNITY): Payer: Self-pay

## 2023-03-23 ENCOUNTER — Encounter (HOSPITAL_BASED_OUTPATIENT_CLINIC_OR_DEPARTMENT_OTHER): Payer: Self-pay | Admitting: Family Medicine

## 2023-03-23 ENCOUNTER — Other Ambulatory Visit (HOSPITAL_COMMUNITY): Payer: Self-pay

## 2023-03-23 ENCOUNTER — Ambulatory Visit (HOSPITAL_BASED_OUTPATIENT_CLINIC_OR_DEPARTMENT_OTHER): Payer: 59 | Admitting: Family Medicine

## 2023-03-23 DIAGNOSIS — I10 Essential (primary) hypertension: Secondary | ICD-10-CM | POA: Diagnosis not present

## 2023-03-23 DIAGNOSIS — E785 Hyperlipidemia, unspecified: Secondary | ICD-10-CM

## 2023-03-23 DIAGNOSIS — Z0000 Encounter for general adult medical examination without abnormal findings: Secondary | ICD-10-CM

## 2023-03-23 MED ORDER — LISINOPRIL 10 MG PO TABS
10.0000 mg | ORAL_TABLET | Freq: Every day | ORAL | 1 refills | Status: DC
  Filled 2023-03-23: qty 90, 90d supply, fill #0
  Filled 2023-06-16: qty 90, 90d supply, fill #1

## 2023-03-23 MED ORDER — AMLODIPINE BESYLATE 5 MG PO TABS
5.0000 mg | ORAL_TABLET | Freq: Every day | ORAL | 1 refills | Status: DC
  Filled 2023-03-23: qty 90, 90d supply, fill #0
  Filled 2023-06-16: qty 90, 90d supply, fill #1

## 2023-03-23 NOTE — Assessment & Plan Note (Signed)
Unfortunately, patient is overdue for follow-up, with last appointment being more than 1 year ago.  He has been without medication for a few days or so.  Denies any issues at this time with chest pain or headaches.  Has not been checking blood pressure regularly at home. Blood pressure is elevated on initial reading today.  Did have slight improvement on recheck. We will have patient restart medications, prescription sent to pharmacy on file.  Will plan to follow-up in about 6 to 8 weeks to monitor blood pressure, complete CPE.  Recommend intermittent monitoring of blood pressure at home, DASH diet

## 2023-03-23 NOTE — Assessment & Plan Note (Signed)
Noted on prior labs, is due for recheck of cholesterol panel, we will plan to complete with labs for upcoming physical.  Given notable elevation of total cholesterol and LDL in the past, may need to consider initiating statin therapy in the future

## 2023-03-23 NOTE — Progress Notes (Signed)
    Procedures performed today:    None.  Independent interpretation of notes and tests performed by another provider:   None.  Brief History, Exam, Impression, and Recommendations:    BP (!) 164/104 (BP Location: Right Arm, Patient Position: Sitting, Cuff Size: Normal)   Pulse (!) 51   Ht 5\' 6"  (1.676 m)   Wt 166 lb 8 oz (75.5 kg)   SpO2 100%   BMI 26.87 kg/m   Hyperlipidemia, unspecified hyperlipidemia type Assessment & Plan: Noted on prior labs, is due for recheck of cholesterol panel, we will plan to complete with labs for upcoming physical.  Given notable elevation of total cholesterol and LDL in the past, may need to consider initiating statin therapy in the future   Essential (primary) hypertension Assessment & Plan: Unfortunately, patient is overdue for follow-up, with last appointment being more than 1 year ago.  He has been without medication for a few days or so.  Denies any issues at this time with chest pain or headaches.  Has not been checking blood pressure regularly at home. Blood pressure is elevated on initial reading today.  Did have slight improvement on recheck. We will have patient restart medications, prescription sent to pharmacy on file.  Will plan to follow-up in about 6 to 8 weeks to monitor blood pressure, complete CPE.  Recommend intermittent monitoring of blood pressure at home, DASH diet  Orders: -     amLODIPine Besylate; Take 1 tablet (5 mg total) by mouth daily.  Dispense: 90 tablet; Refill: 1 -     Lisinopril; Take 1 tablet (10 mg total) by mouth daily.  Dispense: 90 tablet; Refill: 1  Wellness examination -     CBC with Differential/Platelet; Future -     Comprehensive metabolic panel; Future -     Hemoglobin A1c; Future -     Lipid panel; Future -     TSH Rfx on Abnormal to Free T4; Future  Return in about 6 weeks (around 05/04/2023) for CPE with fasting labs 1 week prior.   ___________________________________________ Nasim Garofano de [place],  MD, ABFM, CAQSM Primary Care and Sports Medicine [facility]

## 2023-03-23 NOTE — Patient Instructions (Signed)
  Medication Instructions:  Your physician recommends that you continue on your current medications as directed. Please refer to the Current Medication list given to you today. --If you need a refill on any your medications before your next appointment, please call your pharmacy first. If no refills are authorized on file call the office.-- Lab Work: Your physician has recommended that you have lab work today: 1 week before next visit If you have labs (blood work) drawn today and your tests are completely normal, you will receive your results via MyChart message OR a phone call from our staff.  Please ensure you check your voicemail in the event that you authorized detailed messages to be left on a delegated number. If you have any lab test that is abnormal or we need to change your treatment, we will call you to review the results.    Follow-Up: Your next appointment:   Your physician recommends that you schedule a follow-up appointment in: 6 weeks physical  with Dr. de [place]  You will receive a text message or e-mail with a link to a survey about your care and experience with [place] today! We would greatly appreciate your feedback!   Thanks for letting us be apart of your health journey!!  Primary Care and Sports Medicine   Dr. Ceasar Mons [place]   We encourage you to activate your patient portal called "MyChart".  Sign up information is provided on this After Visit Summary.  MyChart is used to connect with patients for Virtual Visits (Telemedicine).  Patients are able to view lab/test results, encounter notes, upcoming appointments, etc.  Non-urgent messages can be sent to your provider as well. To learn more about what you can do with MyChart, please visit --  [email or web address].

## 2023-03-29 ENCOUNTER — Encounter (HOSPITAL_BASED_OUTPATIENT_CLINIC_OR_DEPARTMENT_OTHER): Payer: Self-pay | Admitting: Family Medicine

## 2023-03-29 DIAGNOSIS — R197 Diarrhea, unspecified: Secondary | ICD-10-CM

## 2023-05-02 ENCOUNTER — Other Ambulatory Visit (HOSPITAL_BASED_OUTPATIENT_CLINIC_OR_DEPARTMENT_OTHER): Payer: 59

## 2023-05-02 ENCOUNTER — Encounter (HOSPITAL_BASED_OUTPATIENT_CLINIC_OR_DEPARTMENT_OTHER): Payer: Self-pay

## 2023-05-09 ENCOUNTER — Ambulatory Visit (HOSPITAL_BASED_OUTPATIENT_CLINIC_OR_DEPARTMENT_OTHER): Payer: Commercial Managed Care - PPO | Admitting: Family Medicine

## 2023-05-09 ENCOUNTER — Encounter (HOSPITAL_BASED_OUTPATIENT_CLINIC_OR_DEPARTMENT_OTHER): Payer: Self-pay | Admitting: Family Medicine

## 2023-05-09 VITALS — BP 118/78 | HR 64 | Temp 98.0°F | Ht 66.0 in | Wt 163.9 lb

## 2023-05-09 DIAGNOSIS — Z0000 Encounter for general adult medical examination without abnormal findings: Secondary | ICD-10-CM | POA: Diagnosis not present

## 2023-05-09 DIAGNOSIS — Z23 Encounter for immunization: Secondary | ICD-10-CM

## 2023-05-09 NOTE — Patient Instructions (Signed)
  Medication Instructions:  Your physician recommends that you continue on your current medications as directed. Please refer to the Current Medication list given to you today. --If you need a refill on any your medications before your next appointment, please call your pharmacy first. If no refills are authorized on file call the office.-- Lab Work: Your physician has recommended that you have lab work today: when fasting If you have labs (blood work) drawn today and your tests are completely normal, you will receive your results via MyChart message OR a phone call from our staff.  Please ensure you check your voicemail in the event that you authorized detailed messages to be left on a delegated number. If you have any lab test that is abnormal or we need to change your treatment, we will call you to review the results.   Follow-Up: Your next appointment:   Your physician recommends that you schedule a follow-up appointment in: 3 month follow up  with Dr. de [place]  You will receive a text message or e-mail with a link to a survey about your care and experience with [place] today! We would greatly appreciate your feedback!   Thanks for letting us be apart of your health journey!!  Primary Care and Sports Medicine   Dr. Ceasar Mons [place]   We encourage you to activate your patient portal called "MyChart".  Sign up information is provided on this After Visit Summary.  MyChart is used to connect with patients for Virtual Visits (Telemedicine).  Patients are able to view lab/test results, encounter notes, upcoming appointments, etc.  Non-urgent messages can be sent to your provider as well. To learn more about what you can do with MyChart, please visit --  [email or web address].

## 2023-05-09 NOTE — Progress Notes (Signed)
Subjective:    CC: Annual Physical Exam  HPI:  Ryan Rosario is a 58 y.o. presenting for annual physical  I reviewed the past medical history, family history, social history, surgical history, and allergies today and no changes were needed.  Please see the problem list section below in epic for further details.  Past Medical History: Past Medical History:  Diagnosis Date   Hyperlipidemia 04/13/2021   Hypertension    Leg cramps 04/11/2014   hx   Neck pain    hx - pinch nerve   Past Surgical History: Past Surgical History:  Procedure Laterality Date   EYE SURGERY Left    [facility] - 1974   Social History: Social History   Socioeconomic History   Marital status: Divorced    Spouse name: Not on file   Number of children: 1   Years of education: BS degree   Highest education level: Bachelor's degree (e.g., BA, AB, BS)  Occupational History   Occupation: Physiological scientist  Tobacco Use   Smoking status: Never    Passive exposure: Never   Smokeless tobacco: Never  Vaping Use   Vaping status: Never Used  Substance and Sexual Activity   Alcohol use: No    Comment: No use since 1983   Drug use: No   Sexual activity: Not Currently  Other Topics Concern   Not on file  Social History Narrative   Right-handed.   1-2 cups caffeine per day.   Social Drivers of Corporate investment banker Strain: Low Risk  (03/22/2023)   Overall Financial Resource Strain (CARDIA)    Difficulty of Paying Living Expenses: Not very hard  Food Insecurity: No Food Insecurity (03/22/2023)   Hunger Vital Sign    Worried About Running Out of Food in the Last Year: Never true    Ran Out of Food in the Last Year: Never true  Transportation Needs: No Transportation Needs (03/22/2023)   PRAPARE - Administrator, Civil Service (Medical): No    Lack of Transportation (Non-Medical): No  Physical Activity: Unknown (03/22/2023)   Exercise Vital Sign    Days of Exercise per Week: 0 days     Minutes of Exercise per Session: Not on file  Stress: No Stress Concern Present (03/22/2023)   [employer] of Occupational Health - Occupational Stress Questionnaire    Feeling of Stress : Only a little  Social Connections: Moderately Integrated (03/22/2023)   Social Connection and Isolation Panel [NHANES]    Frequency of Communication with Friends and Family: More than three times a week    Frequency of Social Gatherings with Friends and Family: Once a week    Attends Religious Services: More than 4 times per year    Active Member of [employer] or Organizations: Yes    Attends Engineer, structural: More than 4 times per year    Marital Status: Divorced   Family History: Family History  Problem Relation Age of Onset   Hypertension Mother    Aneurysm Mother    Kidney disease Father    Hypertension Father    Diabetes Father    Congestive Heart Failure Father    Cancer Sister    Cancer Maternal Grandmother        lung   Diabetes Maternal Grandfather    Colon cancer Neg Hx    Colon polyps Neg Hx    Esophageal cancer Neg Hx    Rectal cancer Neg Hx    Stomach cancer Neg Hx  Allergies: No Known Allergies Medications: See med rec.  Review of Systems: No headache, visual changes, nausea, vomiting, diarrhea, constipation, dizziness, abdominal pain, skin rash, fevers, chills, night sweats, swollen lymph nodes, weight loss, chest pain, body aches, joint swelling, muscle aches, shortness of breath, mood changes, visual or auditory hallucinations.  Objective:    BP 118/78 (BP Location: Left Arm, Patient Position: Sitting, Cuff Size: Normal)   Pulse 64   Temp 98 F (36.7 C) (Oral)   Ht 5\' 6"  (1.676 m)   Wt 163 lb 14.4 oz (74.3 kg)   SpO2 97%   BMI 26.45 kg/m   General: Well Developed, well nourished, and in no acute distress. Neuro: Alert and oriented x3, extra-ocular muscles intact, sensation grossly intact. Cranial nerves II through XII are intact, motor, sensory,  and coordinative functions are all intact. HEENT: Normocephalic, atraumatic, pupils equal round reactive to light, neck supple, no masses, no lymphadenopathy, thyroid nonpalpable. Oropharynx, nasopharynx, external ear canals are unremarkable. Skin: Warm and dry, no rashes noted. Cardiac: Regular rate and rhythm, no murmurs rubs or gallops. Respiratory: Clear to auscultation bilaterally. Not using accessory muscles, speaking in full sentences. Abdominal: Soft, nontender, nondistended, positive bowel sounds, no masses, no organomegaly. Musculoskeletal: Shoulder, elbow, wrist, hip, knee, ankle stable, and with full range of motion.  Impression and Recommendations:    Wellness examination Assessment & Plan: Routine HCM labs ordered. HCM reviewed/discussed. Anticipatory guidance regarding healthy weight, lifestyle and choices given. Recommend healthy diet.  Recommend approximately 150 minutes/week of moderate intensity exercise Recommend regular dental and vision exams Always use seatbelt/lap and shoulder restraints Recommend using smoke alarms and checking batteries at least twice a year Recommend using sunscreen when outside Discussed colon cancer screening recommendations, options.  Patient is UTD Discussed recommendations for shingles vaccine. Discussed tetanus immunization recommendations, patient interested, complete at next office visit   Other orders -     Tdap vaccine greater than or equal to 7yo IM  Blood pressure borderline in office today, improved compared to last office visit.  Has been taking medications as prescribed.  No current issues with chest pain or headaches.  Plan to follow-up in about 3 months to monitor progress.  Return in about 3 months (around 08/07/2023) for hypertension.   ___________________________________________ Timithy Arons de [place], MD, ABFM, CAQSM Primary Care and Sports Medicine [facility]

## 2023-05-09 NOTE — Assessment & Plan Note (Signed)
Routine HCM labs ordered. HCM reviewed/discussed. Anticipatory guidance regarding healthy weight, lifestyle and choices given. Recommend healthy diet.  Recommend approximately 150 minutes/week of moderate intensity exercise Recommend regular dental and vision exams Always use seatbelt/lap and shoulder restraints Recommend using smoke alarms and checking batteries at least twice a year Recommend using sunscreen when outside Discussed colon cancer screening recommendations, options.  Patient is UTD Discussed recommendations for shingles vaccine. Discussed tetanus immunization recommendations, patient interested, complete at next office visit

## 2023-05-10 ENCOUNTER — Other Ambulatory Visit (HOSPITAL_COMMUNITY): Payer: Self-pay

## 2023-05-10 ENCOUNTER — Ambulatory Visit (HOSPITAL_COMMUNITY)
Admission: EM | Admit: 2023-05-10 | Discharge: 2023-05-10 | Disposition: A | Discharge status: Home / Self Care | Payer: Commercial Managed Care - PPO | Attending: Family Medicine | Admitting: Family Medicine

## 2023-05-10 ENCOUNTER — Encounter (HOSPITAL_COMMUNITY): Payer: Self-pay

## 2023-05-10 DIAGNOSIS — N3000 Acute cystitis without hematuria: Secondary | ICD-10-CM | POA: Diagnosis not present

## 2023-05-10 LAB — POCT URINALYSIS DIP (MANUAL ENTRY)
Bilirubin, UA: NEGATIVE
Glucose, UA: 500 mg/dL — AB
Ketones, POC UA: NEGATIVE mg/dL
Spec Grav, UA: 1.025
Urobilinogen, UA: 0.2 U/dL
pH, UA: 5.5

## 2023-05-10 LAB — POCT FASTING CBG KUC MANUAL ENTRY: POCT Glucose (KUC): 173 mg/dL — AB (ref 70–99)

## 2023-05-10 MED ORDER — SULFAMETHOXAZOLE-TRIMETHOPRIM 800-160 MG PO TABS
1.0000 | ORAL_TABLET | Freq: Two times a day (BID) | ORAL | 0 refills | Status: AC
Start: 2023-05-10 — End: 2023-05-17
  Filled 2023-05-10: qty 14, 7d supply, fill #0

## 2023-05-10 NOTE — Discharge Instructions (Signed)
Take all antibiotics as prescribed, even if you start feeling better.  Take antibiotics with food to help prevent gastrointestinal upset.  Ensure you are drinking at least 64 ounces of water daily to help flush your kidneys and avoid urinary irritants such as caffeine, sodas, tea and juices.  You did have some blood in your urine, this could be from your urinary tract infection.  Please get this rechecked in the next month or so at your primary care provider to ensure resolution.  Return to clinic for any new or urgent symptoms, or no improvement despite finishing antibiotics.

## 2023-05-10 NOTE — ED Triage Notes (Signed)
Pt c/o burning and pain on urination since he woke up this am. Denies taking any meds.

## 2023-05-10 NOTE — ED Provider Notes (Signed)
[facility]    CSN: [id number] Arrival date & time: 05/10/23  0805      History   Chief Complaint Chief Complaint  Patient presents with   Urinary Tract Infection    HPI Ryan Rosario is a 58 y.o. male.   Patient presents to clinic over urgency, frequency, dysuria and urinary odor that started this morning.  He also noticed some bilateral lower back pain/discomfort.  He was seen at his primary care provider yesterday for his annual physical, did not have any medication adjustments.  Has not had any fevers.  No abdominal pain.  Reports a history of kidney stones, but that was much more painful.  No hematuria.  Denies any penile discharge, no scrotal pain.  Denies any concern for STIs.  Did have honey in his coffee this morning.  He is hypertensive, reports compliance with his blood pressure medications.  The history is provided by the patient and medical records.  Urinary Tract Infection   Past Medical History:  Diagnosis Date   Hyperlipidemia 04/13/2021   Hypertension    Leg cramps 04/11/2014   hx   Neck pain    hx - pinch nerve    Patient Active Problem List   Diagnosis Date Noted   Diarrhea 02/01/2022   Abnormal bowel movement 12/14/2021   Wellness examination 11/02/2021   Hyperlipidemia 04/13/2021   Essential (primary) hypertension 05/21/2015   Cervical spondylosis without myelopathy 04/09/2015    Past Surgical History:  Procedure Laterality Date   EYE SURGERY Left    [facility] - 1974       Home Medications    Prior to Admission medications   Medication Sig Start Date End Date Taking? Authorizing Provider  sulfamethoxazole-trimethoprim (BACTRIM DS) 800-160 MG tablet Take 1 tablet by mouth 2 (two) times daily for 7 days. 05/10/23 05/17/23 Yes Rinaldo Ratel, [place] N, FNP  amLODipine (NORVASC) 5 MG tablet Take 1 tablet (5 mg total) by mouth daily. 03/23/23   de [place], Buren Kos, MD  lisinopril (ZESTRIL) 10 MG tablet Take 1 tablet (10 mg total) by mouth  daily. 03/23/23   de [place], Raymond J, MD    Family History Family History  Problem Relation Age of Onset   Hypertension Mother    Aneurysm Mother    Kidney disease Father    Hypertension Father    Diabetes Father    Congestive Heart Failure Father    Cancer Sister    Cancer Maternal Grandmother        lung   Diabetes Maternal Grandfather    Colon cancer Neg Hx    Colon polyps Neg Hx    Esophageal cancer Neg Hx    Rectal cancer Neg Hx    Stomach cancer Neg Hx     Social History Social History   Tobacco Use   Smoking status: Never    Passive exposure: Never   Smokeless tobacco: Never  Vaping Use   Vaping status: Never Used  Substance Use Topics   Alcohol use: No    Comment: No use since 1983   Drug use: No     Allergies   Patient has no known allergies.   Review of Systems Review of Systems  Per HPI   Physical Exam Triage Vital Signs ED Triage Vitals [05/10/23 0846]  Encounter Vitals Group     BP (!) 152/94     Systolic BP Percentile      Diastolic BP Percentile      Pulse Rate 61  Resp 18     Temp 98.4 F (36.9 C)     Temp Source Oral     SpO2 97 %     Weight      Height      Head Circumference      Peak Flow      Pain Score 0     Pain Loc      Pain Education      Exclude from Growth Chart    No data found.  Updated Vital Signs BP (!) 152/94 (BP Location: Left Arm)   Pulse 61   Temp 98.4 F (36.9 C) (Oral)   Resp 18   SpO2 97%   Visual Acuity Right Eye Distance:   Left Eye Distance:   Bilateral Distance:    Right Eye Near:   Left Eye Near:    Bilateral Near:     Physical Exam Vitals and nursing note reviewed.  Constitutional:      Appearance: Normal appearance.  HENT:     Head: Normocephalic and atraumatic.     Right Ear: External ear normal.     Left Ear: External ear normal.     Nose: Nose normal.     Mouth/Throat:     Mouth: Mucous membranes are moist.  Eyes:     Conjunctiva/sclera: Conjunctivae normal.   Cardiovascular:     Rate and Rhythm: Normal rate.  Pulmonary:     Effort: Pulmonary effort is normal. No respiratory distress.  Abdominal:     Tenderness: There is no right CVA tenderness or left CVA tenderness.  Musculoskeletal:        General: Normal range of motion.  Skin:    General: Skin is warm and dry.  Neurological:     General: No focal deficit present.     Mental Status: He is alert and oriented to person, place, and time.  Psychiatric:        Mood and Affect: Mood normal.        Behavior: Behavior normal.      UC Treatments / Results  Labs (all labs ordered are listed, but only abnormal results are displayed) Labs Reviewed  POCT URINALYSIS DIP (MANUAL ENTRY) - Abnormal; Notable for the following components:      Result Value   Glucose, UA =500 (*)    Blood, UA moderate (*)    Protein Ur, POC trace (*)    Leukocytes, UA Small (1+) (*)    All other components within normal limits  POCT FASTING CBG [facility] MANUAL ENTRY - Abnormal; Notable for the following components:   POCT Glucose ([facility]) 173 (*)    All other components within normal limits  URINE CULTURE    EKG   Radiology No results found.  Procedures Procedures (including critical care time)  Medications Ordered in UC Medications - No data to display  Initial Impression / Assessment and Plan / UC Course  I have reviewed the triage vital signs and the nursing notes.  Pertinent labs & imaging results that were available during my care of the patient were reviewed by me and considered in my medical decision making (see chart for details).  Vitals and triage reviewed, patient is hemodynamically stable.  Urgency, frequency, and malodorous urine since this morning.  Urinalysis reveals glucose, protein, red blood cells and leukocytes.  Negative for CVA tenderness, without abdominal pain, tachycardia or fever, low concern for pyelonephritis.  Patient reports low concern for STIs and no penile discharge.  Will  treat  with Bactrim for acute cystitis, urine sent for culture.  Glucose was 500 and urine, CBG 173, nonfasting.  Encouraged PCP follow-up for repeat urinalysis in the next month or so to ensure red blood cells have cleared.  Plan of care, follow-up care return precautions given, no questions at this time.     Final Clinical Impressions(s) / UC Diagnoses   Final diagnoses:  Acute cystitis without hematuria     Discharge Instructions      Take all antibiotics as prescribed, even if you start feeling better.  Take antibiotics with food to help prevent gastrointestinal upset.  Ensure you are drinking at least 64 ounces of water daily to help flush your kidneys and avoid urinary irritants such as caffeine, sodas, tea and juices.  You did have some blood in your urine, this could be from your urinary tract infection.  Please get this rechecked in the next month or so at your primary care provider to ensure resolution.  Return to clinic for any new or urgent symptoms, or no improvement despite finishing antibiotics.      ED Prescriptions     Medication Sig Dispense Auth. Provider   sulfamethoxazole-trimethoprim (BACTRIM DS) 800-160 MG tablet Take 1 tablet by mouth 2 (two) times daily for 7 days. 14 tablet Hart Haas, [place] N, [place]      PDMP not reviewed this encounter.   Rinaldo Ratel [place] N, [place] 05/10/23 [phone]

## 2023-05-12 LAB — URINE CULTURE: Culture: 80000 — AB

## 2023-05-17 ENCOUNTER — Other Ambulatory Visit (HOSPITAL_BASED_OUTPATIENT_CLINIC_OR_DEPARTMENT_OTHER): Payer: Self-pay | Admitting: Family Medicine

## 2023-05-17 DIAGNOSIS — Z0000 Encounter for general adult medical examination without abnormal findings: Secondary | ICD-10-CM | POA: Diagnosis not present

## 2023-05-18 ENCOUNTER — Encounter (HOSPITAL_BASED_OUTPATIENT_CLINIC_OR_DEPARTMENT_OTHER): Payer: Self-pay | Admitting: Family Medicine

## 2023-05-18 LAB — CBC WITH DIFFERENTIAL/PLATELET
Basophils Absolute: 0 10*3/uL (ref 0.0–0.2)
Basos: 1 %
EOS (ABSOLUTE): 0.1 10*3/uL (ref 0.0–0.4)
Eos: 2 %
Hematocrit: 42 % (ref 37.5–51.0)
Hemoglobin: 13.6 g/dL (ref 13.0–17.7)
Immature Grans (Abs): 0 10*3/uL (ref 0.0–0.1)
Immature Granulocytes: 0 %
Lymphocytes Absolute: 2.3 10*3/uL (ref 0.7–3.1)
Lymphs: 40 %
MCH: 30.3 pg (ref 26.6–33.0)
MCHC: 32.4 g/dL (ref 31.5–35.7)
MCV: 94 fL (ref 79–97)
Monocytes Absolute: 0.5 10*3/uL (ref 0.1–0.9)
Monocytes: 9 %
Neutrophils Absolute: 2.8 10*3/uL (ref 1.4–7.0)
Neutrophils: 48 %
Platelets: 266 10*3/uL (ref 150–450)
RBC: 4.49 x10E6/uL (ref 4.14–5.80)
RDW: 12.7 % (ref 11.6–15.4)
WBC: 5.8 10*3/uL (ref 3.4–10.8)

## 2023-05-18 LAB — LIPID PANEL
Chol/HDL Ratio: 5.5 {ratio} — ABNORMAL HIGH (ref 0.0–5.0)
Cholesterol, Total: 242 mg/dL — ABNORMAL HIGH (ref 100–199)
HDL: 44 mg/dL (ref >39)
LDL Chol Calc (NIH): 182 mg/dL — ABNORMAL HIGH (ref 0–99)
Triglycerides: 90 mg/dL (ref 0–149)
VLDL Cholesterol Cal: 16 mg/dL (ref 5–40)

## 2023-05-18 LAB — COMPREHENSIVE METABOLIC PANEL
ALT: 17 [IU]/L (ref 0–44)
AST: 20 [IU]/L (ref 0–40)
Albumin: 4.5 g/dL (ref 3.8–4.9)
Alkaline Phosphatase: 97 [IU]/L (ref 44–121)
BUN/Creatinine Ratio: 9 (ref 9–20)
BUN: 10 mg/dL (ref 6–24)
Bilirubin Total: 0.7 mg/dL (ref 0.0–1.2)
CO2: 22 mmol/L (ref 20–29)
Calcium: 10.3 mg/dL — ABNORMAL HIGH (ref 8.7–10.2)
Chloride: 101 mmol/L (ref 96–106)
Creatinine, Ser: 1.06 mg/dL (ref 0.76–1.27)
Globulin, Total: 2.8 g/dL (ref 1.5–4.5)
Glucose: 99 mg/dL (ref 70–99)
Potassium: 5.2 mmol/L (ref 3.5–5.2)
Sodium: 138 mmol/L (ref 134–144)
Total Protein: 7.3 g/dL (ref 6.0–8.5)
eGFR: 81 mL/min/{1.73_m2} (ref >59)

## 2023-05-18 LAB — TSH RFX ON ABNORMAL TO FREE T4: TSH: 1.37 u[IU]/mL (ref 0.450–4.500)

## 2023-05-18 LAB — HEMOGLOBIN A1C
Est. average glucose Bld gHb Est-mCnc: 131 mg/dL
Hgb A1c MFr Bld: 6.2 % — ABNORMAL HIGH (ref 4.8–5.6)

## 2023-05-25 NOTE — Progress Notes (Unsigned)
Chief Complaint:diarrhea Primary GI Doctor:Dr. Marina Goodell  HPI:  Patient is a  58  year old male patient with past medical history of hyperlipidemia, hypertension*****who was referred to me by de [place], Buren Kos, MD on 03/30/23 for a complaint of diarrhea .    10/02/2015 colonoscopy with Dr. Marina Goodell, recall 10 years Impression:  - The entire examined colon is normal on direct and retroflexion views.  - No specimens collected.  Interval History  Patient admits/denies GERD Patient admits/denies dysphagia Patient admits/denies nausea, vomiting, or weight loss  Patient admits/denies altered bowel habits Patient admits/denies abdominal pain Patient admits/denies rectal bleeding   Denies/Admits alcohol Denies/Admits smoking Denies/Admits NSAID use. Denies/Admits they are on blood thinners.  Patients last colonoscopy Patients last EGD  Patient's family history includes  Wt Readings from Last 3 Encounters:  05/09/23 163 lb 14.4 oz (74.3 kg)  03/23/23 166 lb 8 oz (75.5 kg)  02/01/22 160 lb (72.6 kg)      Past Medical History:  Diagnosis Date   Hyperlipidemia 04/13/2021   Hypertension    Leg cramps 04/11/2014   hx   Neck pain    hx - pinch nerve    Past Surgical History:  Procedure Laterality Date   EYE SURGERY Left    [facility] - 1974    Current Outpatient Medications  Medication Sig Dispense Refill   amLODipine (NORVASC) 5 MG tablet Take 1 tablet (5 mg total) by mouth daily. 90 tablet 1   lisinopril (ZESTRIL) 10 MG tablet Take 1 tablet (10 mg total) by mouth daily. 90 tablet 1   No current facility-administered medications for this visit.    Allergies as of 05/26/2023   (No Known Allergies)    Family History  Problem Relation Age of Onset   Hypertension Mother    Aneurysm Mother    Kidney disease Father    Hypertension Father    Diabetes Father    Congestive Heart Failure Father    Cancer Sister    Cancer Maternal Grandmother        lung   Diabetes Maternal  Grandfather    Colon cancer Neg Hx    Colon polyps Neg Hx    Esophageal cancer Neg Hx    Rectal cancer Neg Hx    Stomach cancer Neg Hx     Review of Systems:    Constitutional: No weight loss, fever, chills, weakness or fatigue HEENT: Eyes: No change in vision               Ears, Nose, Throat:  No change in hearing or congestion Skin: No rash or itching Cardiovascular: No chest pain, chest pressure or palpitations   Respiratory: No SOB or cough Gastrointestinal: See HPI and otherwise negative Genitourinary: No dysuria or change in urinary frequency Neurological: No headache, dizziness or syncope Musculoskeletal: No new muscle or joint pain Hematologic: No bleeding or bruising Psychiatric: No history of depression or anxiety    Physical Exam:  Vital signs: There were no vitals taken for this visit.  Constitutional:   Pleasant Caucasian male*** appears to be in NAD, Well developed, Well nourished, alert and cooperative Head:  Normocephalic and atraumatic. Eyes:   PEERL, EOMI. No icterus. Conjunctiva pink. Ears:  Normal auditory acuity. Neck:  Supple Throat: Oral cavity and pharynx without inflammation, swelling or lesion.  Respiratory: Respirations even and unlabored. Lungs clear to auscultation bilaterally.   No wheezes, crackles, or rhonchi.  Cardiovascular: Normal S1, S2. Regular rate and rhythm. No peripheral edema, cyanosis  or pallor.  Gastrointestinal:  Soft, nondistended, nontender. No rebound or guarding. Normal bowel sounds. No appreciable masses or hepatomegaly. Rectal:  Not performed.  Anoscopy: Msk:  Symmetrical without gross deformities. Without edema, no deformity or joint abnormality.  Neurologic:  Alert and  oriented x4;  grossly normal neurologically.  Skin:   Dry and intact without significant lesions or rashes. Psychiatric: Oriented to person, place and time. Demonstrates good judgement and reason without abnormal affect or behaviors.  RELEVANT LABS AND  IMAGING: CBC    Latest Ref Rng & Units 05/17/2023    8:14 AM 10/26/2021    9:26 AM 04/13/2021   10:43 AM  CBC  WBC 3.4 - 10.8 x10E3/uL 5.8  5.2  5.5   Hemoglobin 13.0 - 17.7 g/dL 16.1  09.6  04.5   Hematocrit 37.5 - 51.0 % 42.0  47.7  50.4   Platelets 150 - 450 x10E3/uL 266  252  257      CMP     Latest Ref Rng & Units 05/17/2023    8:14 AM 10/26/2021    9:26 AM 05/11/2021   10:43 AM  CMP  Glucose 70 - 99 mg/dL 99  88  85   BUN 6 - 24 mg/dL 10  11  17    Creatinine 0.76 - 1.27 mg/dL 4.09  8.11  9.14   Sodium 134 - 144 mmol/L 138  138  144   Potassium 3.5 - 5.2 mmol/L 5.2  4.8  4.5   Chloride 96 - 106 mmol/L 101  100  100   CO2 20 - 29 mmol/L 22  26  26    Calcium 8.7 - 10.2 mg/dL 78.2  95.6  21.3   Total Protein 6.0 - 8.5 g/dL 7.3  7.2    Total Bilirubin 0.0 - 1.2 mg/dL 0.7  0.8    Alkaline Phos 44 - 121 IU/L 97  79    AST 0 - 40 IU/L 20  21    ALT 0 - 44 IU/L 17  24       Lab Results  Component Value Date   TSH 1.370 05/17/2023     Assessment: 1. ***  Plan: 1. ***   Thank you for the courtesy of this consult. Please call me with any questions or concerns.   Nichols Corter, FNP-C [facility] 05/25/2023, 9:32 AM  Cc: de [place], Buren Kos, MD

## 2023-05-26 ENCOUNTER — Encounter: Payer: Self-pay | Admitting: Gastroenterology

## 2023-05-26 ENCOUNTER — Ambulatory Visit: Payer: Commercial Managed Care - PPO | Admitting: Gastroenterology

## 2023-05-26 VITALS — BP 120/84 | HR 65 | Ht 66.0 in | Wt 163.0 lb

## 2023-05-26 DIAGNOSIS — R143 Flatulence: Secondary | ICD-10-CM | POA: Diagnosis not present

## 2023-05-26 DIAGNOSIS — K529 Noninfective gastroenteritis and colitis, unspecified: Secondary | ICD-10-CM | POA: Diagnosis not present

## 2023-05-26 DIAGNOSIS — R197 Diarrhea, unspecified: Secondary | ICD-10-CM

## 2023-05-26 NOTE — Patient Instructions (Addendum)
You have been given a testing kit to check for small intestine bacterial overgrowth (SIBO) which is completed by a company named [employer]. Make sure to return your test in the mail using the return mailing label given to you along with the kit. The test order, your demographic and insurance information have all already been sent to the company. [facility] will collect an upfront charge of $99.74 for commercial insurance plans and $209.74 if you are paying cash. Make sure to discuss with [facility] PRIOR to having the test to see if they have gotten information from your insurance company as to how much your testing will cost out of pocket, if any. Please contact [facility] at phone number [phone] to get instructions regarding how to perform the test as our office is unable to give specific testing instructions.  Low FODMAP diet  Psyllium husk - 1 teaspoon daily and titrate up  _______________________________________________________  If your blood pressure at your visit was 140/90 or greater, please contact your primary care physician to follow up on this.  _______________________________________________________  If you are age 39 or older, your body mass index should be between 23-30. Your Body mass index is 26.31 kg/m. If this is out of the aforementioned range listed, please consider follow up with your Primary Care Provider.  If you are age 52 or younger, your body mass index should be between 19-25. Your Body mass index is 26.31 kg/m. If this is out of the aformentioned range listed, please consider follow up with your Primary Care Provider.   ________________________________________________________  The [facility] providers would like to encourage you to use [facility] to communicate with providers for non-urgent requests or questions.  Due to long hold times on the telephone, sending your provider a message by [facility] may be a faster and more efficient way to get a  response.  Please allow 48 business hours for a response.  Please remember that this is for non-urgent requests.  _______________________________________________________

## 2023-05-26 NOTE — Progress Notes (Signed)
Reviewed and agree with initial plans

## 2023-08-07 ENCOUNTER — Ambulatory Visit (HOSPITAL_BASED_OUTPATIENT_CLINIC_OR_DEPARTMENT_OTHER): Payer: Commercial Managed Care - PPO | Admitting: Family Medicine

## 2023-09-13 ENCOUNTER — Other Ambulatory Visit (HOSPITAL_BASED_OUTPATIENT_CLINIC_OR_DEPARTMENT_OTHER): Payer: Self-pay | Admitting: Family Medicine

## 2023-09-13 ENCOUNTER — Other Ambulatory Visit (HOSPITAL_COMMUNITY): Payer: Self-pay

## 2023-09-13 DIAGNOSIS — I10 Essential (primary) hypertension: Secondary | ICD-10-CM

## 2023-09-13 MED ORDER — AMLODIPINE BESYLATE 5 MG PO TABS
5.0000 mg | ORAL_TABLET | Freq: Every day | ORAL | 1 refills | Status: DC
  Filled 2023-09-21: qty 90, 90d supply, fill #0
  Filled 2023-12-20: qty 90, 90d supply, fill #1

## 2023-09-13 MED ORDER — LISINOPRIL 10 MG PO TABS
10.0000 mg | ORAL_TABLET | Freq: Every day | ORAL | 1 refills | Status: AC
Start: 2023-09-13 — End: ?
  Filled 2023-09-21: qty 90, 90d supply, fill #0
  Filled 2023-12-20: qty 90, 90d supply, fill #1

## 2023-09-19 ENCOUNTER — Other Ambulatory Visit (HOSPITAL_COMMUNITY): Payer: Self-pay

## 2023-09-20 ENCOUNTER — Ambulatory Visit (HOSPITAL_BASED_OUTPATIENT_CLINIC_OR_DEPARTMENT_OTHER): Admitting: Family Medicine

## 2023-09-20 ENCOUNTER — Encounter (HOSPITAL_BASED_OUTPATIENT_CLINIC_OR_DEPARTMENT_OTHER): Payer: Self-pay | Admitting: Family Medicine

## 2023-09-20 VITALS — BP 121/81 | HR 60 | Ht 66.0 in | Wt 160.2 lb

## 2023-09-20 DIAGNOSIS — R197 Diarrhea, unspecified: Secondary | ICD-10-CM | POA: Diagnosis not present

## 2023-09-20 DIAGNOSIS — R7303 Prediabetes: Secondary | ICD-10-CM | POA: Insufficient documentation

## 2023-09-20 DIAGNOSIS — I10 Essential (primary) hypertension: Secondary | ICD-10-CM | POA: Diagnosis not present

## 2023-09-20 DIAGNOSIS — Z131 Encounter for screening for diabetes mellitus: Secondary | ICD-10-CM | POA: Diagnosis not present

## 2023-09-20 LAB — POCT GLYCOSYLATED HEMOGLOBIN (HGB A1C)
HbA1c POC (<> result, manual entry): 5.6 % (ref 4.0–5.6)
HbA1c, POC (controlled diabetic range): 5.6 % (ref 0.0–7.0)
Hemoglobin A1C: 5.6 % (ref 4.0–5.6)

## 2023-09-20 NOTE — Patient Instructions (Signed)

## 2023-09-20 NOTE — Assessment & Plan Note (Signed)
 Observed on prior labs, patient has been having gradual increase in hemoglobin A1c, most recent was 6.2 earlier this year.  Recommend proceeding with recheck of A1c at this time for monitoring.  Recommend lifestyle modifications, reviewed with patient, handout provided

## 2023-09-20 NOTE — Assessment & Plan Note (Signed)
 Blood pressure borderline at goal in office today with diastolic very slightly above goal for patient.  He denies any current issues with chest pain or headaches.  Continues with medications as prescribed. Given current blood pressure control, can continue with current regimen, no changes made today.  Recommend focusing on lifestyle modifications to assist with further optimization of blood pressure.  Recommend intermittent monitoring of blood pressure at home

## 2023-09-20 NOTE — Assessment & Plan Note (Signed)
 Patient continues to have intermittent issues with diarrhea, bloating, flatulence.  He did have evaluation with GI.  Was recommended to have specific testing done, however he has not completed this as of yet.  He does not currently scheduled follow-up with GI. Would recommend proceeding with testing as recommended by GI and arrange for follow-up to be completed shortly thereafter.  Patient indicates that he will look to do so.

## 2023-09-20 NOTE — Progress Notes (Signed)
    Procedures performed today:    None.  Independent interpretation of notes and tests performed by another provider:   None.  Brief History, Exam, Impression, and Recommendations:    BP 121/81 (BP Location: Left Arm, Patient Position: Sitting, Cuff Size: Normal)   Pulse 60   Ht 5' 6 (1.676 m)   Wt 160 lb 3.2 oz (72.7 kg)   SpO2 97%   BMI 25.86 kg/m   Essential (primary) hypertension Assessment & Plan: Blood pressure borderline at goal in office today with diastolic very slightly above goal for patient.  He denies any current issues with chest pain or headaches.  Continues with medications as prescribed. Given current blood pressure control, can continue with current regimen, no changes made today.  Recommend focusing on lifestyle modifications to assist with further optimization of blood pressure.  Recommend intermittent monitoring of blood pressure at home   Diarrhea, unspecified type Assessment & Plan: Patient continues to have intermittent issues with diarrhea, bloating, flatulence.  He did have evaluation with GI.  Was recommended to have specific testing done, however he has not completed this as of yet.  He does not currently scheduled follow-up with GI. Would recommend proceeding with testing as recommended by GI and arrange for follow-up to be completed shortly thereafter.  Patient indicates that he will look to do so.   Screening for diabetes mellitus -     POCT glycosylated hemoglobin (Hb A1C)  Prediabetes Assessment & Plan: Observed on prior labs, patient has been having gradual increase in hemoglobin A1c, most recent was 6.2 earlier this year.  Recommend proceeding with recheck of A1c at this time for monitoring.  Recommend lifestyle modifications, reviewed with patient, handout provided  Orders: -     POCT glycosylated hemoglobin (Hb A1C)  Return in about 3 months (around 12/21/2023) for hypertension,  prediabetes.   ___________________________________________ Tayja Manzer de [place], MD, ABFM, CAQSM Primary Care and Sports Medicine [facility]

## 2023-09-21 ENCOUNTER — Other Ambulatory Visit (HOSPITAL_COMMUNITY): Payer: Self-pay

## 2023-09-21 ENCOUNTER — Other Ambulatory Visit: Payer: Self-pay

## 2023-09-22 ENCOUNTER — Other Ambulatory Visit: Payer: Self-pay

## 2023-09-22 ENCOUNTER — Other Ambulatory Visit (HOSPITAL_COMMUNITY): Payer: Self-pay

## 2023-11-28 ENCOUNTER — Encounter (HOSPITAL_BASED_OUTPATIENT_CLINIC_OR_DEPARTMENT_OTHER): Payer: Self-pay | Admitting: Family Medicine

## 2023-11-28 ENCOUNTER — Ambulatory Visit (HOSPITAL_BASED_OUTPATIENT_CLINIC_OR_DEPARTMENT_OTHER): Admitting: Family Medicine

## 2023-11-28 ENCOUNTER — Other Ambulatory Visit (HOSPITAL_COMMUNITY): Payer: Self-pay

## 2023-11-28 VITALS — BP 136/82 | HR 60 | Temp 98.8°F | Ht 66.0 in | Wt 161.4 lb

## 2023-11-28 DIAGNOSIS — H66001 Acute suppurative otitis media without spontaneous rupture of ear drum, right ear: Secondary | ICD-10-CM | POA: Diagnosis not present

## 2023-11-28 MED ORDER — AMOXICILLIN-POT CLAVULANATE 875-125 MG PO TABS
1.0000 | ORAL_TABLET | Freq: Two times a day (BID) | ORAL | 0 refills | Status: DC
  Filled 2023-11-28: qty 14, 7d supply, fill #0

## 2023-11-28 MED ORDER — FLUTICASONE PROPIONATE 50 MCG/ACT NA SUSP
2.0000 | Freq: Every day | NASAL | 6 refills | Status: AC
  Filled 2023-11-28: qty 16, 30d supply, fill #0

## 2023-11-28 NOTE — Progress Notes (Signed)
 Acute Care Office Visit  Subjective:   Ryan Rosario May 27, 1965 11/28/2023  Chief Complaint  Patient presents with   Acute Visit    Patient states he's been having a very bad earaches for the past three months.     HPI: OTALGIA: Onset: Approx. 3 months ago   Location: Right Ear    Sensation of fullness:  yes and intermittent ear pain Ear discharge:  no URI symptoms:  yes, recurrent drainage in the throat in the morning. He states the drainage is making him sick to his stomach at times.  Fever:   no Tinnitus: no Dizziness:  no Hearing loss:  no Headache:  no Toothache:  no Rash:  no  Treatments tried: Homeopathic ear drops for swimmers brand; OTC Allergy medication.    Red Flags Recent trauma:  no PMH recurrent OM:  no PMH prior ear surgery: no Tubes currently:  no Recent antibiotic usage (last 30 days): no Diabetes or Immunosuppresion:   no    The following portions of the patient's history were reviewed and updated as appropriate: past medical history, past surgical history, family history, social history, allergies, medications, and problem list.   Patient Active Problem List   Diagnosis Date Noted   Prediabetes 09/20/2023   Diarrhea 02/01/2022   Abnormal bowel movement 12/14/2021   Wellness examination 11/02/2021   Hyperlipidemia 04/13/2021   Essential (primary) hypertension 05/21/2015   Cervical spondylosis without myelopathy 04/09/2015   Past Medical History:  Diagnosis Date   Hyperlipidemia 04/13/2021   Hypertension    Leg cramps 04/11/2014   hx   Neck pain    hx - pinch nerve   Past Surgical History:  Procedure Laterality Date   EYE SURGERY Left    [facility] - 1974   Family History  Problem Relation Age of Onset   Hypertension Mother    Aneurysm Mother    Kidney disease Father    Hypertension Father    Diabetes Father    Congestive Heart Failure Father    Cancer Sister    Cancer Maternal Grandmother        lung   Diabetes Maternal  Grandfather    Colon cancer Neg Hx    Colon polyps Neg Hx    Esophageal cancer Neg Hx    Rectal cancer Neg Hx    Stomach cancer Neg Hx    Outpatient Medications Prior to Visit  Medication Sig Dispense Refill   amLODipine  (NORVASC ) 5 MG tablet Take 1 tablet (5 mg total) by mouth daily. 90 tablet 1   Homeopathic Products (EARACHE DROPS) SOLN Place 1 % in ear(s) daily.     lisinopril  (ZESTRIL ) 10 MG tablet Take 1 tablet (10 mg total) by mouth daily. 90 tablet 1   No facility-administered medications prior to visit.   No Known Allergies   ROS: A complete ROS was performed with pertinent positives/negatives noted in the HPI. The remainder of the ROS are negative.    Objective:   Today's Vitals   11/28/23 1303 11/28/23 1325  BP: (!) 142/85 136/82  Pulse: 60   Temp: 98.8 F (37.1 C)   SpO2: 98%   Weight: 161 lb 6.4 oz (73.2 kg)   Height: 5' 6 (1.676 m)     GENERAL: Well-appearing, in NAD. Well nourished.  SKIN: Pink, warm and dry. No rash, lesion, ulceration, or ecchymoses.  Head: Normocephalic. NECK: Trachea midline. Full ROM w/o pain or tenderness. No lymphadenopathy.  EARS:Right TM is intact, mildly injected  and effusion present to right TM. Left Tympanic membranes are intact, translucent without bulging and without drainage. Appropriate landmarks visualized.  EYES: Conjunctiva clear without exudates. EOMI, PERRL, no drainage present.  NOSE: Septum midline w/o deformity. Nares patent, mucosa pink and non-inflamed w/o drainage. No sinus tenderness.  THROAT: Uvula midline. Oropharynx clear. Mucous membranes pink and moist.  RESPIRATORY: Chest wall symmetrical. Respirations even and non-labored. Breath sounds clear to auscultation bilaterally.  MSK: Muscle tone and strength appropriate for age. NEUROLOGIC: No motor or sensory deficits. Steady, even gait. C2-C12 intact.  PSYCH/MENTAL STATUS: Alert, oriented x 3. Cooperative, appropriate mood and affect.    No results found for  any visits on 11/28/23.    Assessment & Plan:  1. Non-recurrent acute suppurative otitis media of right ear without spontaneous rupture of tympanic membrane (Primary) Start flonase  BID and continue OTC allergy medication. Start Augmentin  Bid given duration of symptoms and ear pain. If no improvement in 1 week, reach out to PCP.    Meds ordered this encounter  Medications   amoxicillin -clavulanate (AUGMENTIN ) 875-125 MG tablet    Sig: Take 1 tablet by mouth 2 (two) times daily for 7 days.    Dispense:  14 tablet    Refill:  0    Supervising Provider:   DE PERU, RAYMOND J [[id number]]   fluticasone  (FLONASE ) 50 MCG/ACT nasal spray    Sig: Place 2 sprays into both nostrils daily.    Dispense:  16 g    Refill:  6    Supervising Provider:   DE PERU, RAYMOND J [[id number]]   Lab Orders  No laboratory test(s) ordered today    Return if symptoms worsen or fail to improve.    Patient to reach out to office if new, worrisome, or unresolved symptoms arise or if no improvement in patient's condition. Patient verbalized understanding and is agreeable to treatment plan. All questions answered to patient's satisfaction.    Thersia Schuyler Stark, [place]

## 2023-11-28 NOTE — Patient Instructions (Signed)
 Flonase  BID Augmentin - take 1 tablet twice daily with food.   Continue your allergy medication.

## 2023-12-05 ENCOUNTER — Ambulatory Visit: Payer: Self-pay

## 2023-12-05 ENCOUNTER — Other Ambulatory Visit (HOSPITAL_COMMUNITY): Payer: Self-pay

## 2023-12-05 DIAGNOSIS — H9209 Otalgia, unspecified ear: Secondary | ICD-10-CM

## 2023-12-05 MED ORDER — AMOXICILLIN-POT CLAVULANATE 875-125 MG PO TABS
1.0000 | ORAL_TABLET | Freq: Two times a day (BID) | ORAL | 0 refills | Status: DC
  Filled 2023-12-05: qty 10, 5d supply, fill #0

## 2023-12-05 NOTE — Addendum Note (Signed)
 Addended by: DE PERU, Ardean Simonich J on: 12/05/2023 11:56 AM   Modules accepted: Orders

## 2023-12-05 NOTE — Telephone Encounter (Signed)
 Pt states that he took his last antibiotic this morning and he states the discomfort in his right ear is back today. Pt is requesting more antibiotics and a referral to ENT. Please advise and pt was offered an appt for today but states he doesn't want to come in 3 times for the same issue.

## 2023-12-05 NOTE — Telephone Encounter (Signed)
 FYI Only or Action Required?: Action required by provider: referral request and update on patient condition.  Patient was last seen in primary care on 11/28/2023 by Ryan Thersia Bitters, FNP.  Called Nurse Triage reporting Otalgia.  Symptoms began today.  Interventions attempted: Prescription medications: Augmentin .  Symptoms are: gradually worsening.  Triage Disposition: See Physician Within 24 Hours  Patient/caregiver understands and will follow disposition?: No, wishes to speak with PCP     Copied from CRM #[id number]. Topic: Clinical - Red Word Triage >> Dec 05, 2023 11:03 AM Ryan Rosario wrote: Red Word that prompted transfer to Nurse Triage: was taking antibiotics and his right ear pain coming back. A little nausea but no other symptoms. Would like what to know what to do. Reason for Disposition  [1] Taking antibiotic > 72 hours (3 days) and [2] pain persists or recurs  Answer Assessment - Initial Assessment Questions 1. ANTIBIOTIC: What antibiotic are you taking? How many times per day?     Augmentin  twice a day 2. ONSET: When was the antibiotic started?     7 days ago- took last dose this morning 3. LOCATION: Which ear is involved?     Right ear 4. PAIN: How bad is the pain?   (Scale 0-10; none, mild, moderate or severe)     2/10 5. FEVER: Do you have a fever? If Yes, ask: What is your temperature, how was it measured, and when did it start?     no 6. DISCHARGE: Is there any discharge? If Yes, ask: What color is it? (e.g., clear, white; yellow, green; bloody)     no 7. OTHER SYMPTOMS: Do you have any other symptoms? (e.g., headache, stiff neck, dizziness, vomiting, runny nose)  Protocols used: Ear - Otitis Media Follow-up Call-A-AH

## 2023-12-21 ENCOUNTER — Encounter (HOSPITAL_BASED_OUTPATIENT_CLINIC_OR_DEPARTMENT_OTHER): Payer: Self-pay | Admitting: Family Medicine

## 2023-12-21 ENCOUNTER — Ambulatory Visit (HOSPITAL_BASED_OUTPATIENT_CLINIC_OR_DEPARTMENT_OTHER): Admitting: Family Medicine

## 2023-12-21 VITALS — BP 128/81 | HR 63 | Ht 66.0 in | Wt 160.0 lb

## 2023-12-21 DIAGNOSIS — Z23 Encounter for immunization: Secondary | ICD-10-CM | POA: Diagnosis not present

## 2023-12-21 DIAGNOSIS — Z0000 Encounter for general adult medical examination without abnormal findings: Secondary | ICD-10-CM

## 2023-12-21 DIAGNOSIS — I10 Essential (primary) hypertension: Secondary | ICD-10-CM | POA: Diagnosis not present

## 2023-12-21 DIAGNOSIS — R7303 Prediabetes: Secondary | ICD-10-CM

## 2023-12-21 NOTE — Patient Instructions (Signed)
  Medication Instructions:  Your physician recommends that you continue on your current medications as directed. Please refer to the Current Medication list given to you today. --If you need a refill on any your medications before your next appointment, please call your pharmacy first. If no refills are authorized on file call the office.-- Lab Work: Your physician has recommended that you have lab work today: 1 week before next visit If you have labs (blood work) drawn today and your tests are completely normal, you will receive your results via MyChart message OR a phone call from our staff.  Please ensure you check your voicemail in the event that you authorized detailed messages to be left on a delegated number. If you have any lab test that is abnormal or we need to change your treatment, we will call you to review the results.   Follow-Up: Your next appointment:   Your physician recommends that you schedule a follow-up appointment in: 5 months physical with Dr. de Peru  You will receive a text message or e-mail with a link to a survey about your care and experience with [place] today! We would greatly appreciate your feedback!   Thanks for letting us be apart of your health journey!!  Primary Care and Sports Medicine   Dr. Ceasar Mons Peru   We encourage you to activate your patient portal called "MyChart".  Sign up information is provided on this After Visit Summary.  MyChart is used to connect with patients for Virtual Visits (Telemedicine).  Patients are able to view lab/test results, encounter notes, upcoming appointments, etc.  Non-urgent messages can be sent to your provider as well. To learn more about what you can do with MyChart, please visit --  [email or web address].

## 2023-12-21 NOTE — Progress Notes (Signed)
    Procedures performed today:    None.  Independent interpretation of notes and tests performed by another provider:   None.  Brief History, Exam, Impression, and Recommendations:    BP 128/81 (BP Location: Right Arm, Patient Position: Sitting, Cuff Size: Normal)   Pulse 63   Ht 5' 6 (1.676 m)   Wt 160 lb (72.6 kg)   SpO2 93%   BMI 25.82 kg/m   Essential (primary) hypertension Assessment & Plan: Blood pressure borderline at goal in office today with diastolic very slightly above goal for patient.  He denies any current issues with chest pain or headaches.  Continues with medications as prescribed. Given current blood pressure control, can continue with current regimen, no changes made today.  Recommend focusing on lifestyle modifications to assist with further optimization of blood pressure.  Recommend intermittent monitoring of blood pressure at home   Encounter for immunization -     Flu vaccine trivalent PF, 6mos and older(Flulaval,Afluria,Fluarix,Fluzone)  Prediabetes Assessment & Plan: Most recent A1c with notable improvement, was within normal range at 5.6%. Can continue with lifestyle modifications. We will check A1c with labs for physical   Wellness examination -     CBC with Differential/Platelet; Future -     Comprehensive metabolic panel with GFR; Future -     Hemoglobin A1c; Future -     Lipid panel; Future -     TSH Rfx on Abnormal to Free T4; Future  Return in about 5 months (around 05/22/2024) for CPE with fasting labs 1 week prior.   ___________________________________________ Emelly Wurtz de [place], MD, ABFM, CAQSM Primary Care and Sports Medicine [facility]

## 2023-12-21 NOTE — Assessment & Plan Note (Signed)
 Most recent A1c with notable improvement, was within normal range at 5.6%. Can continue with lifestyle modifications. We will check A1c with labs for physical

## 2023-12-21 NOTE — Assessment & Plan Note (Signed)
 Blood pressure borderline at goal in office today with diastolic very slightly above goal for patient.  He denies any current issues with chest pain or headaches.  Continues with medications as prescribed. Given current blood pressure control, can continue with current regimen, no changes made today.  Recommend focusing on lifestyle modifications to assist with further optimization of blood pressure.  Recommend intermittent monitoring of blood pressure at home

## 2023-12-22 ENCOUNTER — Encounter (INDEPENDENT_AMBULATORY_CARE_PROVIDER_SITE_OTHER): Payer: Self-pay | Admitting: Physician Assistant

## 2023-12-22 ENCOUNTER — Ambulatory Visit (INDEPENDENT_AMBULATORY_CARE_PROVIDER_SITE_OTHER): Admitting: Physician Assistant

## 2023-12-22 VITALS — BP 142/90 | HR 60 | Ht 66.0 in | Wt 160.0 lb

## 2023-12-22 DIAGNOSIS — H9391 Unspecified disorder of right ear: Secondary | ICD-10-CM

## 2023-12-22 DIAGNOSIS — H9201 Otalgia, right ear: Secondary | ICD-10-CM

## 2023-12-22 NOTE — Progress Notes (Signed)
 Dear Dr. de Peru, Here is my assessment for our mutual patient, Ryan Rosario. Thank you for allowing me the opportunity to care for your patient. Please do not hesitate to contact me should you have any other questions. Sincerely, Chyrl Cohen PA-C  Otolaryngology Clinic Note Referring provider: Dr. de Peru HPI:  Ryan Rosario is a 58 y.o. male kindly referred by Dr. de Peru   The patient is a 58 year old gentleman presenting today for evaluation of right ear pain.  The patient notes approximately 2-1/2 months ago he started to have rather significant right ear pain.  He was seen and evaluated by a healthcare provider who diagnosed him with otitis media.  He was placed on amoxicillin  and Flonase .  He notes he did have some symptomatic improvement while taking the antibiotics.  He was given a second round of the medications as symptoms did not completely resolve, he notes last dose was approximately 4 days ago.  He denied any associated upper respiratory symptoms with this other than some phlegm in the throat.  Originally did have some slight decrease in his hearing, but today notes his hearing is normal.  He denies any associated ringing, no dizziness, no history recurrent infections as a kid.  No surgery to the ears.  No drainage.      Independent Review of Additional Tests or Records:  none   PMH/Meds/All/SocHx/FamHx/ROS:   Past Medical History:  Diagnosis Date   Hyperlipidemia 04/13/2021   Hypertension    Leg cramps 04/11/2014   hx   Neck pain    hx - pinch nerve     Past Surgical History:  Procedure Laterality Date   EYE SURGERY Left    [facility] - 1974    Family History  Problem Relation Age of Onset   Hypertension Mother    Aneurysm Mother    Kidney disease Father    Hypertension Father    Diabetes Father    Congestive Heart Failure Father    Cancer Sister    Cancer Maternal Grandmother        lung   Diabetes Maternal Grandfather    Colon cancer Neg Hx    Colon polyps  Neg Hx    Esophageal cancer Neg Hx    Rectal cancer Neg Hx    Stomach cancer Neg Hx      Social Connections: Moderately Integrated (09/19/2023)   Social Connection and Isolation Panel    Frequency of Communication with Friends and Family: More than three times a week    Frequency of Social Gatherings with Friends and Family: More than three times a week    Attends Religious Services: More than 4 times per year    Active Member of [employer] or Organizations: Yes    Attends Engineer, structural: More than 4 times per year    Marital Status: Divorced      Current Outpatient Medications:    amLODipine  (NORVASC ) 5 MG tablet, Take 1 tablet (5 mg total) by mouth daily., Disp: 90 tablet, Rfl: 1   fluticasone  (FLONASE ) 50 MCG/ACT nasal spray, Place 2 sprays into both nostrils daily., Disp: 16 g, Rfl: 6   lisinopril  (ZESTRIL ) 10 MG tablet, Take 1 tablet (10 mg total) by mouth daily., Disp: 90 tablet, Rfl: 1   Physical Exam:   BP (!) 142/90 Comment: first attempt 142/90 second attempt 151/89  Pulse 60   Ht 5' 6 (1.676 m)   Wt 160 lb (72.6 kg)   SpO2 95%   BMI  25.82 kg/m   Pertinent Findings  CN II-XII intact Bilateral EAC clear and TM intact with well pneumatized middle ear spaces Anterior rhinoscopy: Septum midline; bilateral inferior turbinates with no hypertrophy  No lesions of oral cavity/oropharynx; dentition wnl No obviously palpable neck masses/lymphadenopathy/thyromegaly No respiratory distress or stridor  Seprately Identifiable Procedures:  None  Impression & Plans:  Ryan Rosario is a 58 y.o. male with the following   Right ear pain-  Likely secondary to otitis media based on previous providers reports.  No signs of infection on exam today, hearing is normal.  No history reoccurrence.  He notes some lingering foreign sensation in the right ear but no significant pain.  I recommend continue using Flonase , if his symptoms do not resolve or worsen I like to see him back  in the office.  The patient verbalized understanding and agreement to today's plan had no further questions or concerns.   - f/u PRN   Thank you for allowing me the opportunity to care for your patient. Please do not hesitate to contact me should you have any other questions.  Sincerely, Chyrl Cohen PA-C [facility] Phone: [phone] Fax: [phone]  12/22/2023, 11:08 AM

## 2024-01-25 ENCOUNTER — Encounter (HOSPITAL_BASED_OUTPATIENT_CLINIC_OR_DEPARTMENT_OTHER): Payer: Self-pay | Admitting: Family Medicine

## 2024-02-09 ENCOUNTER — Ambulatory Visit (INDEPENDENT_AMBULATORY_CARE_PROVIDER_SITE_OTHER): Admitting: Audiology

## 2024-03-04 ENCOUNTER — Other Ambulatory Visit (INDEPENDENT_AMBULATORY_CARE_PROVIDER_SITE_OTHER): Payer: Self-pay | Admitting: Physician Assistant

## 2024-03-04 DIAGNOSIS — H9201 Otalgia, right ear: Secondary | ICD-10-CM

## 2024-03-08 ENCOUNTER — Ambulatory Visit (INDEPENDENT_AMBULATORY_CARE_PROVIDER_SITE_OTHER): Admitting: Audiology

## 2024-03-19 ENCOUNTER — Other Ambulatory Visit (HOSPITAL_BASED_OUTPATIENT_CLINIC_OR_DEPARTMENT_OTHER): Payer: Self-pay | Admitting: Family Medicine

## 2024-03-19 ENCOUNTER — Other Ambulatory Visit (HOSPITAL_COMMUNITY): Payer: Self-pay

## 2024-03-19 ENCOUNTER — Other Ambulatory Visit: Payer: Self-pay

## 2024-03-19 DIAGNOSIS — I10 Essential (primary) hypertension: Secondary | ICD-10-CM

## 2024-03-19 MED ORDER — AMLODIPINE BESYLATE 5 MG PO TABS
5.0000 mg | ORAL_TABLET | Freq: Every day | ORAL | 1 refills | Status: AC
  Filled 2024-03-19: qty 90, 90d supply, fill #0

## 2024-03-22 ENCOUNTER — Other Ambulatory Visit (HOSPITAL_BASED_OUTPATIENT_CLINIC_OR_DEPARTMENT_OTHER): Payer: Self-pay | Admitting: Family Medicine

## 2024-03-22 ENCOUNTER — Encounter (HOSPITAL_COMMUNITY): Payer: Self-pay

## 2024-03-22 DIAGNOSIS — I10 Essential (primary) hypertension: Secondary | ICD-10-CM

## 2024-03-25 ENCOUNTER — Other Ambulatory Visit (HOSPITAL_COMMUNITY): Payer: Self-pay

## 2024-03-25 MED ORDER — LISINOPRIL 10 MG PO TABS
10.0000 mg | ORAL_TABLET | Freq: Every day | ORAL | 1 refills | Status: AC
  Filled 2024-03-25: qty 90, 90d supply, fill #0

## 2024-03-29 ENCOUNTER — Ambulatory Visit (INDEPENDENT_AMBULATORY_CARE_PROVIDER_SITE_OTHER): Admitting: Audiology

## 2024-03-29 DIAGNOSIS — H903 Sensorineural hearing loss, bilateral: Secondary | ICD-10-CM

## 2024-03-29 NOTE — Progress Notes (Signed)
" °  [address], [address] [place], [place] [address] [phone]  Audiological Evaluation    Name: Yoel Kaufhold     DOB:   11/29/1965      MRN:   [id number]                                                                                     Service Date: 03/29/2024     Accompanied by: self     Patient comes today after Reyes Cohen, PA-C sent a referral for a hearing evaluation due to concerns with ear pain.   Symptoms Yes Details  Hearing loss  []    Tinnitus  []    Ear pain/ infections/pressure  [x]  Right ear pain last time he was here; not today. He has been using Flonase  and other medication.  and it helped  Balance problems  []    Noise exposure history  []    Previous ear surgeries  []    Family history of hearing loss  [x]  Father, with age  Amplification  []    Other  []      Otoscopy: Right ear: Clear external ear canal and notable landmarks visualized on the tympanic membrane. Left ear:  Clear external ear canal and notable landmarks visualized on the tympanic membrane.  Tympanometry: Right ear: Type A - Normal external ear canal volume with normal middle ear pressure and normal tympanic membrane compliance. Findings are consistent with normal middle ear function. Left ear: Type A - Normal external ear canal volume with normal middle ear pressure and normal tympanic membrane compliance. Findings are consistent with normal middle ear function.   Hearing Evaluation The hearing test results were completed under headphones and results are deemed to be of good reliability. Test technique:  conventional    Pure tone Audiometry: Right ear- Normal to mild sensorineural hearing loss from 250 Hz - 8000 Hz. Left ear-  Normal to mild sensorineural hearing loss from 250 Hz - 8000 Hz.  Speech Audiometry: Right ear- Speech Reception Threshold (SRT) was obtained at 10 dBHL. Left ear-Speech Reception Threshold (SRT) was obtained at 5 dBHL.   Word Recognition Score Tested using  NU-6 (recorded) Right ear: 100% was obtained at a presentation level of 65 dBHL with contralateral masking which is deemed as  excellent. Left ear: 100% was obtained at a presentation level of 65 dBHL with contralateral masking which is deemed as  excellent.   Impression: There is a slight difference between ears- worse from 1500-2000 Hz in the right ear.   Recommendations: Follow up with ENT as scheduled. Repeat audiogram after medical care. Consider a communication needs assessment for amplification after medical clearance is obtained, if needed.   Yorley Buch MARIE LEROUX-MARTINEZ, AUD  "

## 2024-05-09 ENCOUNTER — Other Ambulatory Visit (HOSPITAL_COMMUNITY): Payer: Self-pay

## 2024-05-09 ENCOUNTER — Ambulatory Visit (INDEPENDENT_AMBULATORY_CARE_PROVIDER_SITE_OTHER): Admitting: Family Medicine

## 2024-05-09 ENCOUNTER — Encounter (HOSPITAL_BASED_OUTPATIENT_CLINIC_OR_DEPARTMENT_OTHER): Payer: Self-pay | Admitting: Family Medicine

## 2024-05-09 VITALS — BP 140/89 | HR 53 | Temp 97.6°F | Resp 18 | Ht 66.0 in | Wt 158.0 lb

## 2024-05-09 DIAGNOSIS — Z0000 Encounter for general adult medical examination without abnormal findings: Secondary | ICD-10-CM | POA: Diagnosis not present

## 2024-05-09 MED ORDER — CYCLOBENZAPRINE HCL 5 MG PO TABS
5.0000 mg | ORAL_TABLET | Freq: Three times a day (TID) | ORAL | 1 refills | Status: AC | PRN
  Filled 2024-05-09: qty 30, 10d supply, fill #0

## 2024-05-09 NOTE — Assessment & Plan Note (Signed)
 Routine HCM labs ordered. HCM reviewed/discussed. Anticipatory guidance regarding healthy weight, lifestyle and choices given. Recommend healthy diet.  Recommend approximately 150 minutes/week of moderate intensity exercise Recommend regular dental and vision exams Always use seatbelt/lap and shoulder restraints Recommend using smoke alarms and checking batteries at least twice a year Recommend using sunscreen when outside Discussed colon cancer screening recommendations, options.  Patient is UTD - next due 2027 Discussed recommendations for shingles vaccine. Discussed immunization recommendations

## 2024-05-09 NOTE — Progress Notes (Signed)
 " Subjective:    CC: Annual Physical Exam  HPI: Ryan Rosario is a 59 y.o. presenting for annual physical  I reviewed the past medical history, family history, social history, surgical history, and allergies today and no changes were needed.  Please see the problem list section below in epic for further details.  Past Medical History: Past Medical History:  Diagnosis Date   Hyperlipidemia 04/13/2021   Hypertension    Leg cramps 04/11/2014   hx   Neck pain    hx - pinch nerve   Past Surgical History: Past Surgical History:  Procedure Laterality Date   EYE SURGERY Left    [facility] - 1974   Social History: Social History   Socioeconomic History   Marital status: Divorced    Spouse name: Not on file   Number of children: 1   Years of education: BS degree   Highest education level: Bachelor's degree (e.g., BA, AB, BS)  Occupational History   Occupation: Physiological Scientist  Tobacco Use   Smoking status: Never    Passive exposure: Never   Smokeless tobacco: Never  Vaping Use   Vaping status: Never Used  Substance and Sexual Activity   Alcohol use: Never    Comment: No use since 1983   Drug use: Never   Sexual activity: Not Currently  Other Topics Concern   Not on file  Social History Narrative   Right-handed.   1-2 cups caffeine per day.   Social Drivers of Health   Tobacco Use: Low Risk (05/09/2024)   Patient History    Smoking Tobacco Use: Never    Smokeless Tobacco Use: Never    Passive Exposure: Never  Financial Resource Strain: Low Risk (05/05/2024)   Overall Financial Resource Strain (CARDIA)    Difficulty of Paying Living Expenses: Not very hard  Food Insecurity: No Food Insecurity (05/05/2024)   Epic    Worried About Programme Researcher, Broadcasting/film/video in the Last Year: Never true    Ran Out of Food in the Last Year: Never true  Transportation Needs: No Transportation Needs (05/05/2024)   Epic    Lack of Transportation (Medical): No    Lack of Transportation (Non-Medical):  No  Physical Activity: Inactive (09/19/2023)   Exercise Vital Sign    Days of Exercise per Week: 1 day    Minutes of Exercise per Session: 0 min  Stress: Stress Concern Present (09/19/2023)   [employer] of Occupational Health - Occupational Stress Questionnaire    Feeling of Stress : To some extent  Social Connections: Moderately Isolated (05/05/2024)   Social Connection and Isolation Panel    Frequency of Communication with Friends and Family: Three times a week    Frequency of Social Gatherings with Friends and Family: Once a week    Attends Religious Services: More than 4 times per year    Active Member of Clubs or Organizations: No    Attends Engineer, Structural: Not on file    Marital Status: Divorced  Depression (PHQ2-9): Low Risk (12/21/2023)   Depression (PHQ2-9)    PHQ-2 Score: 0  Alcohol Screen: Low Risk (09/19/2023)   Alcohol Screen    Last Alcohol Screening Score (AUDIT): 1  Housing: Low Risk (05/05/2024)   Epic    Unable to Pay for Housing in the Last Year: No    Number of Times Moved in the Last Year: 0    Homeless in the Last Year: No  Utilities: Not on file  Health Literacy: Not  on file   Family History: Family History  Problem Relation Age of Onset   Hypertension Mother    Aneurysm Mother    Kidney disease Father    Hypertension Father    Diabetes Father    Congestive Heart Failure Father    Cancer Sister    Cancer Maternal Grandmother        lung   Diabetes Maternal Grandfather    Colon cancer Neg Hx    Colon polyps Neg Hx    Esophageal cancer Neg Hx    Rectal cancer Neg Hx    Stomach cancer Neg Hx    Allergies: Allergies[1] Medications: See med rec.  Review of Systems: No headache, visual changes, nausea, vomiting, diarrhea, constipation, dizziness, abdominal pain, skin rash, fevers, chills, night sweats, swollen lymph nodes, weight loss, chest pain, body aches, joint swelling, muscle aches, shortness of breath, mood changes, visual  or auditory hallucinations.  Objective:    BP (!) 140/89 (BP Location: Left Arm, Patient Position: Sitting, Cuff Size: Normal)   Pulse (!) 53   Temp 97.6 F (36.4 C) (Oral)   Resp 18   Ht 5' 6 (1.676 m)   Wt 158 lb (71.7 kg)   SpO2 98%   BMI 25.50 kg/m   General: Well Developed, well nourished, and in no acute distress.  Neuro: Alert and oriented x3, extra-ocular muscles intact, sensation grossly intact. Cranial nerves II through XII are intact, motor, sensory, and coordinative functions are all intact. HEENT: Normocephalic, atraumatic, pupils equal round reactive to light, neck supple, no masses, no lymphadenopathy, thyroid nonpalpable. Oropharynx, nasopharynx, external ear canals are unremarkable. Skin: Warm and dry, no rashes noted.  Cardiac: Regular rate and rhythm, no murmurs rubs or gallops.  Respiratory: Clear to auscultation bilaterally. Not using accessory muscles, speaking in full sentences.  Abdominal: Soft, nontender, nondistended, positive bowel sounds, no masses, no organomegaly.  Musculoskeletal: Shoulder, elbow, wrist, hip, knee, ankle stable, and with full range of motion.  Impression and Recommendations:    Wellness examination Assessment & Plan: Routine HCM labs ordered. HCM reviewed/discussed. Anticipatory guidance regarding healthy weight, lifestyle and choices given. Recommend healthy diet.  Recommend approximately 150 minutes/week of moderate intensity exercise Recommend regular dental and vision exams Always use seatbelt/lap and shoulder restraints Recommend using smoke alarms and checking batteries at least twice a year Recommend using sunscreen when outside Discussed colon cancer screening recommendations, options.  Patient is UTD - next due 2027 Discussed recommendations for shingles vaccine. Discussed immunization recommendations   Other orders -     Cyclobenzaprine  HCl; Take 1 tablet (5 mg total) by mouth 3 (three) times daily as needed for muscle  spasms.  Dispense: 30 tablet; Refill: 1  Fasting labs in next 1-2 weeks  Return in about 4 months (around 09/06/2024) for hypertension.   ___________________________________________ [name], MD, ABFM, CAQSM Primary Care and Sports Medicine [facility]    [1] No Known Allergies  "

## 2024-05-15 ENCOUNTER — Other Ambulatory Visit (HOSPITAL_BASED_OUTPATIENT_CLINIC_OR_DEPARTMENT_OTHER): Payer: Self-pay | Admitting: Family Medicine

## 2024-05-15 DIAGNOSIS — Z0000 Encounter for general adult medical examination without abnormal findings: Secondary | ICD-10-CM

## 2024-05-16 ENCOUNTER — Ambulatory Visit (HOSPITAL_BASED_OUTPATIENT_CLINIC_OR_DEPARTMENT_OTHER): Payer: Self-pay | Admitting: Family Medicine

## 2024-05-16 LAB — CBC WITH DIFFERENTIAL/PLATELET
Basophils Absolute: 0 10*3/uL (ref 0.0–0.2)
Basos: 0 %
EOS (ABSOLUTE): 0.1 10*3/uL (ref 0.0–0.4)
Eos: 2 %
Hematocrit: 49.3 % (ref 37.5–51.0)
Hemoglobin: 16.1 g/dL (ref 13.0–17.7)
Immature Grans (Abs): 0 10*3/uL (ref 0.0–0.1)
Immature Granulocytes: 0 %
Lymphocytes Absolute: 2.1 10*3/uL (ref 0.7–3.1)
Lymphs: 44 %
MCH: 32.5 pg (ref 26.6–33.0)
MCHC: 32.7 g/dL (ref 31.5–35.7)
MCV: 100 fL — ABNORMAL HIGH (ref 79–97)
Monocytes Absolute: 0.4 10*3/uL (ref 0.1–0.9)
Monocytes: 8 %
Neutrophils Absolute: 2.3 10*3/uL (ref 1.4–7.0)
Neutrophils: 46 %
Platelets: 235 10*3/uL (ref 150–450)
RBC: 4.95 x10E6/uL (ref 4.14–5.80)
RDW: 12.6 % (ref 11.6–15.4)
WBC: 4.9 10*3/uL (ref 3.4–10.8)

## 2024-05-16 LAB — HEMOGLOBIN A1C
Est. average glucose Bld gHb Est-mCnc: 120 mg/dL
Hgb A1c MFr Bld: 5.8 % — ABNORMAL HIGH (ref 4.8–5.6)

## 2024-05-16 LAB — LIPID PANEL
Chol/HDL Ratio: 4.6 ratio (ref 0.0–5.0)
Cholesterol, Total: 251 mg/dL — ABNORMAL HIGH (ref 100–199)
HDL: 55 mg/dL
LDL Chol Calc (NIH): 183 mg/dL — ABNORMAL HIGH (ref 0–99)
Triglycerides: 76 mg/dL (ref 0–149)
VLDL Cholesterol Cal: 13 mg/dL (ref 5–40)

## 2024-05-16 LAB — COMPREHENSIVE METABOLIC PANEL WITH GFR
ALT: 27 [IU]/L (ref 0–44)
AST: 33 [IU]/L (ref 0–40)
Albumin: 4.4 g/dL (ref 3.8–4.9)
Alkaline Phosphatase: 90 [IU]/L (ref 47–123)
BUN/Creatinine Ratio: 14 (ref 9–20)
BUN: 14 mg/dL (ref 6–24)
Bilirubin Total: 0.7 mg/dL (ref 0.0–1.2)
CO2: 22 mmol/L (ref 20–29)
Calcium: 9.6 mg/dL (ref 8.7–10.2)
Chloride: 99 mmol/L (ref 96–106)
Creatinine, Ser: 1.01 mg/dL (ref 0.76–1.27)
Globulin, Total: 2.7 g/dL (ref 1.5–4.5)
Glucose: 85 mg/dL (ref 70–99)
Potassium: 4.5 mmol/L (ref 3.5–5.2)
Sodium: 138 mmol/L (ref 134–144)
Total Protein: 7.1 g/dL (ref 6.0–8.5)
eGFR: 86 mL/min/{1.73_m2}

## 2024-05-16 LAB — TSH RFX ON ABNORMAL TO FREE T4: TSH: 1.24 u[IU]/mL (ref 0.450–4.500)

## 2024-11-06 ENCOUNTER — Ambulatory Visit (HOSPITAL_BASED_OUTPATIENT_CLINIC_OR_DEPARTMENT_OTHER): Admitting: Family Medicine
